# Patient Record
Sex: Male | Born: 1965 | Hispanic: Refuse to answer | Smoking: Never smoker
Health system: Southern US, Community
[De-identification: ages and names within clinical notes are randomized; demographics above are authoritative.]

## PROBLEM LIST (undated history)

## (undated) DIAGNOSIS — R7303 Prediabetes: Secondary | ICD-10-CM

## (undated) DIAGNOSIS — I10 Essential (primary) hypertension: Secondary | ICD-10-CM

## (undated) DIAGNOSIS — M5430 Sciatica, unspecified side: Secondary | ICD-10-CM

## (undated) DIAGNOSIS — F200 Paranoid schizophrenia: Secondary | ICD-10-CM

## (undated) DIAGNOSIS — E119 Type 2 diabetes mellitus without complications: Secondary | ICD-10-CM

## (undated) DIAGNOSIS — K219 Gastro-esophageal reflux disease without esophagitis: Secondary | ICD-10-CM

## (undated) DIAGNOSIS — D352 Benign neoplasm of pituitary gland: Secondary | ICD-10-CM

## (undated) DIAGNOSIS — D649 Anemia, unspecified: Secondary | ICD-10-CM

## (undated) DIAGNOSIS — E7800 Pure hypercholesterolemia, unspecified: Secondary | ICD-10-CM

## (undated) DIAGNOSIS — E1140 Type 2 diabetes mellitus with diabetic neuropathy, unspecified: Secondary | ICD-10-CM

## (undated) DIAGNOSIS — M1990 Unspecified osteoarthritis, unspecified site: Secondary | ICD-10-CM

## (undated) DIAGNOSIS — G4700 Insomnia, unspecified: Secondary | ICD-10-CM

## (undated) HISTORY — DX: Benign neoplasm of pituitary gland: D35.2

## (undated) HISTORY — DX: Type 2 diabetes mellitus with diabetic neuropathy, unspecified: E11.40

## (undated) HISTORY — PX: CARPAL TUNNEL RELEASE: SHX101

## (undated) HISTORY — PX: HERNIA REPAIR: SHX51

## (undated) HISTORY — DX: Gastro-esophageal reflux disease without esophagitis: K21.9

## (undated) HISTORY — PX: BACK SURGERY: SHX140

## (undated) HISTORY — PX: WRIST SURGERY: SHX841

## (undated) HISTORY — PX: EYE SURGERY: SHX253

## (undated) HISTORY — DX: Sciatica, unspecified side: M54.30

## (undated) HISTORY — PX: ROTATOR CUFF REPAIR: SHX139

## (undated) HISTORY — DX: Paranoid schizophrenia: F20.0

## (undated) HISTORY — DX: Unspecified osteoarthritis, unspecified site: M19.90

## (undated) HISTORY — DX: Type 2 diabetes mellitus without complications: E11.9

## (undated) HISTORY — PX: APPENDECTOMY: SHX54

---

## 2004-06-25 ENCOUNTER — Emergency Department (HOSPITAL_COMMUNITY): Admission: EM | Admit: 2004-06-25 | Discharge: 2004-06-25 | Payer: Self-pay | Admitting: Emergency Medicine

## 2005-01-03 ENCOUNTER — Ambulatory Visit: Payer: Self-pay | Admitting: Psychiatry

## 2005-01-03 ENCOUNTER — Inpatient Hospital Stay (HOSPITAL_COMMUNITY): Admission: RE | Admit: 2005-01-03 | Discharge: 2005-01-11 | Payer: Self-pay | Admitting: Psychiatry

## 2008-12-23 ENCOUNTER — Ambulatory Visit (HOSPITAL_COMMUNITY): Admission: RE | Admit: 2008-12-23 | Discharge: 2008-12-23 | Payer: Self-pay | Admitting: Neurosurgery

## 2010-10-04 LAB — GLUCOSE, CAPILLARY
Glucose-Capillary: 111 mg/dL — ABNORMAL HIGH (ref 70–99)
Glucose-Capillary: 120 mg/dL — ABNORMAL HIGH (ref 70–99)
Glucose-Capillary: 171 mg/dL — ABNORMAL HIGH (ref 70–99)

## 2010-10-04 LAB — COMPREHENSIVE METABOLIC PANEL
ALT: 27 U/L (ref 0–53)
AST: 31 U/L (ref 0–37)
Albumin: 4.7 g/dL (ref 3.5–5.2)
Alkaline Phosphatase: 110 U/L (ref 39–117)
BUN: 13 mg/dL (ref 6–23)
CO2: 24 mEq/L (ref 19–32)
Calcium: 10.1 mg/dL (ref 8.4–10.5)
Chloride: 105 mEq/L (ref 96–112)
Creatinine, Ser: 1.17 mg/dL (ref 0.4–1.5)
GFR calc Af Amer: >60 mL/min (ref >60)
GFR calc non Af Amer: >60 mL/min (ref >60)
Glucose, Bld: 127 mg/dL — ABNORMAL HIGH (ref 70–99)
Potassium: 4.4 mEq/L (ref 3.5–5.1)
Sodium: 138 mEq/L (ref 135–145)
Total Bilirubin: 0.6 mg/dL (ref 0.3–1.2)
Total Protein: 7.4 g/dL (ref 6.0–8.3)

## 2010-10-04 LAB — CBC
HCT: 40 % (ref 39.0–52.0)
Hemoglobin: 13.4 g/dL (ref 13.0–17.0)
MCHC: 33.4 g/dL (ref 30.0–36.0)
MCV: 84.5 fL (ref 78.0–100.0)
Platelets: 393 10*3/uL (ref 150–400)
RBC: 4.74 MIL/uL (ref 4.22–5.81)
RDW: 15.7 % — ABNORMAL HIGH (ref 11.5–15.5)
WBC: 10.8 10*3/uL — ABNORMAL HIGH (ref 4.0–10.5)

## 2010-10-04 LAB — DIFFERENTIAL
Basophils Absolute: 0.1 10*3/uL (ref 0.0–0.1)
Basophils Relative: 1 % (ref 0–1)
Eosinophils Absolute: 0.3 10*3/uL (ref 0.0–0.7)
Eosinophils Relative: 3 % (ref 0–5)
Lymphocytes Relative: 16 % (ref 12–46)
Lymphs Abs: 1.7 10*3/uL (ref 0.7–4.0)
Monocytes Absolute: 0.6 10*3/uL (ref 0.1–1.0)
Monocytes Relative: 6 % (ref 3–12)
Neutro Abs: 8.1 10*3/uL — ABNORMAL HIGH (ref 1.7–7.7)
Neutrophils Relative %: 75 % (ref 43–77)

## 2010-10-04 LAB — TYPE AND SCREEN
ABO/RH(D): A POS
Antibody Screen: NEGATIVE

## 2010-10-04 LAB — ABO/RH: ABO/RH(D): A POS

## 2010-11-09 NOTE — Op Note (Signed)
NAME:  Lee [name], Lee [name]                  ACCOUNT NO.:  [id number]   MEDICAL RECORD NO.:  [id number]          PATIENT TYPE:  OIB   LOCATION:  3524                         FACILITY:  [facility]   PHYSICIAN:  Henry A. Pool, M.D.    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   PROCEDURE NAME:  Right L5-S1 laminotomy and microdiskectomy.   SURGEON:  Kathaleen Maser. Pool, MD   ASSISTANT:  Reinaldo Meeker, MD   ANESTHESIA:  General endotracheal.   INDICATIONS:  Lee [name] is a 45 year old male status post previous L3-4  and L4-5 fusion procedures who presents now with back and right lower  extremity pain and paresthesias which is consistent with right-sided S1  radiculopathy.  Workup demonstrates evidence of a right-sided L5-S1 disk  herniation with compression of the right-sided S1 nerve root.  The  patient is counseled as to his options.  He decided to proceed with  simple microdiskectomy in the hopes of improving his symptoms.   OPERATIVE NOTE:  The patient was brought to the operative room and  placed on the operating table in the supine position.  After adequate  level of anesthesia was achieved, the patient was positioned prone onto  a Wilson frame, appropriately padded, the patient's lumbar region was  prepped and draped sterilely.  A 10 blade was used to make a curvilinear  skin incision overlying the L5-S1 interspace.  This was carried down  sharply in the midline.  A subperiosteal dissection was performed on the  right side exposing  the lamina and facet joints of L5 and S1 on the  right side.  Deep self-retaining retractor was placed.  Intraoperative x-  ray was taken and level was confirmed.  Laminotomy was then performed  using high-speed drill and Kerrison rongeurs to remove the inferior  aspect of lamina of L5 and medial aspect of L5-S1 facet joint, superior  rim of the S1 lamina.  Ligament flavum was then elevated and then  resected in the piecemeal fashion using Kerrison rongeurs.  The  underlying thecal sac and S1 nerve root were then identified.  Epidural  venous plexus was coagulated and cut.  Thecal sac and S1 nerve root were  gently mobilized and tracked towards the midline.  Disk herniation was  apparent.  This was then incised with 15 blade in a rectangular fashion.  Wide disk space clean clean-out was then achieved using pituitary  rongeurs, upward angled pituitary rongeurs, and Epstein curettes.  All  loose or obviously degenerative disk material was then removed from the  interspace.  All elements of the disk herniation was completely  resected.  At this point, a very thorough diskectomy was then achieved.  There was no evidence of injury to thecal sac or nerve roots.  Wound was  then irrigated with antibiotic solution.  Gelfoam was placed  topically  for hemostasis and found to be good.  Microscope  and retractor systems were removed.  Hemostasis was achieved with  electrocautery.  Wound was closed in layers with Vicryl suture.  Steri-  Strips and sterile dressing were applied.  There were no complications.  The patient tolerated the procedure well and he returned to the recovery  room postoperatively.           ______________________________  Kathaleen Maser Pool, M.D.     HAP/MEDQ  D:  12/23/2008  T:  12/24/2008  Job:  [id number]

## 2010-11-12 NOTE — H&P (Signed)
[name], [name]                  ACCOUNT NO.:  [id number]   MEDICAL RECORD NO.:  [id number]          PATIENT TYPE:  IPS   LOCATION:  0503                          [facility]   PHYSICIAN:  Lee [name], M.D.      DATE OF BIRTH:  04/13/1966   DATE OF ADMISSION:  01/03/2005  DATE OF DISCHARGE:                         PSYCHIATRIC ADMISSION ASSESSMENT   IDENTIFYING INFORMATION:  A 45 year old separated white male, voluntarily  admitted on January 03, 2005.   HISTORY OF PRESENT ILLNESS:  The patient presents with a history of suicidal  thoughts, increased anxiety, positive auditory hallucinations for  approximately 2 weeks.  States that they are the command type  telling him  to step out into traffic, overdose or jump out of a window.  He has been  sleeping only about 3-4 hours nightly.  He is not eating well.  The patient  reports that he has a history of methamphetamine use for the past 17 years  but has been clean for the past 17 months.  He is currently denying any  suicidal or homicidal thoughts, denies any visual hallucinations.  The  patient's stressors are that he has lost his Medicaid and also had recent  back surgery in May of this year.  Is also living with 8 other people in a  home which he states is very stressful, and has been off is medications for  the past 5 days.  The patient does promise safety on the unit.   PAST PSYCHIATRIC HISTORY:  First admission to [facility], was  hospitalized at [facility] in January of 2006 for overdose on Lunesta.  Also  had an overdose in June of 2005.  He has an upcoming appointment at  [facility], and has a therapist, first name Lee [name].   SOCIAL HISTORY:  He is a 45 year old separated white male, has a 75 year old  child, lives with his mother and brother and other family members.  He  applied for disability.  Denies any criminal charges.   FAMILY HISTORY:  Unclear.   ALCOHOL DRUG HISTORY:  The patient is a nonsmoker,  denies any alcohol use.  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   PAST MEDICAL HISTORY:  Primary care Lee [name] is Dr. Creta [name] at [facility]  in [place].  Medical problems are back pain with recent back surgery in May  of 2006.   MEDICATIONS:  Is prescribed Vicodin by Dr. Dorena [name] in [facility] in  [place].  Gets his medications at [facility] in [place].  Was on Zoloft 150  mg daily, has been off his Zoloft for approximately 5 days.   DRUG ALLERGIES:  NEURONTIN where he reports a history of seizure.   REVIEW OF SYSTEMS:  The patient denies any chest pain, dyspnea, nausea and  vomiting, blurred vision or changes in appetite.  Temperature is 100.  Pulse  is 129, respiratory rate is 24, blood pressure is 162/97.  Five feet 8  inches tall, 244 pounds.   PHYSICAL EXAMINATION:  GENERAL:  Appearance is an overweight, well-nourished  male.  He is somewhat disheveled and seems very anxious, keeps moving his  left leg.  Negative lymphadenopathy.  CHEST:  Clear.  HEART:  Regular rate and rhythm.  ABDOMEN:  Soft, nontender abdomen.  EXTREMITIES:  The patient moves all extremities, no clubbing or deformities.  SKIN:  Warm and dry with no rashes or lacerations noted.  NEUROLOGICAL FINDINGS:  Intact, nonfocal.   LABORATORY DATA:  WBC count is 11.  ALT is 50.  TSH is 2.615.  Alcohol level  is less than 5.   MENTAL STATUS EXAM:  He is a middle-aged male, anxious, rocking his legs,  fair eye contact.  Speech is clear.  Patient feels anxious, affect is also  anxious but he is cooperative.  Thought processes endorsing positive  auditory command-type hallucinations with suicidal thoughts.  Cognitive  function intact.  Memory is good, judgment is fair, insight is fair.   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  AXIS  II:  Deferred.  AXIS III:  Back pain status post back surgery in May of 2006.  AXIS IV:  Other psychosocial problems, medical problems, housing situation,  access to healthcare services.  AXIS V:  Current is 30.   PLAN:  Plan is to stabilize mood and thinking.  We will resume his Zoloft.  We will add Risperdal for hallucinations and calming, mood stabilization.  The patient is to follow up with mental health, continue with his therapist  appointment, and continue to work address the patient's history of substance  abuse.   TENTATIVE LENGTH OF CARE:  5-6 days.       JO/MEDQ  D:  01/04/2005  T:  01/04/2005  Job:  [id number]

## 2010-11-12 NOTE — Discharge Summary (Signed)
NAMEJHOEL, STIEG                  ACCOUNT NO.:  [id number]   MEDICAL RECORD NO.:  [id number]          PATIENT TYPE:  IPS   LOCATION:  0503                          FACILITY:  [facility]   PHYSICIAN:  Geoffery Lyons, M.D.      DATE OF BIRTH:  03-01-1966   DATE OF ADMISSION:  01/03/2005  DATE OF DISCHARGE:  01/11/2005                                 DISCHARGE SUMMARY   CHIEF COMPLAINT AND PRESENT ILLNESS:  This was the first admission to [facility] for this 45 year old separated white male voluntarily  admitted.  History of suicidal thoughts, increased anxiety, positive  auditory hallucinations for two weeks.  Claimed they were the command-type  telling him to step out into traffic, overdose or jump out of a window.  He  has been sleeping only about 3-4 hours nightly and not eating well.  Reported he has a history of methamphetamine use for the past 17 years.  Has  been clean for the past 17 months.  Currently denied any suicidal or  homicidal thoughts.  Denied any visual hallucinations.  He lost his Medicaid  and had recent back surgery.  Living with eight other people at home, which  he claimed was very stressful.  Has been off his medication for five days.   PAST PSYCHIATRIC HISTORY:  First time at [facility].  He was in [facility] in January of 2006 for overdose on Lunesta.  He also had an overdose  in June of 2005.   ALCOHOL/DRUG HISTORY:  Denies any alcohol use.  Reports a history of  methamphetamine addiction for a 17-year period.  Has been clean for 17  months.   PAST MEDICAL HISTORY:  Back pain, recent back surgery.   MEDICATIONS:  Vicodin, Zoloft 150 mg per day (off for five days).   PHYSICAL EXAMINATION:  Performed and failed to show any acute findings.   LABORATORY DATA:  CBC with white blood cells 11.0.  Blood chemistry with  glucose 139.  SGOT 34, SGPT 50.  TSH 2.615.  Drug screen positive for  opioids and benzodiazepines.   MENTAL STATUS EXAM:   Overweight male, somewhat disheveled, seemed very  anxious, kept moving his left leg, rocking his legs.  Fair eye contact.  Speech was clear, normal rate, tempo and production.  Endorsed he was  feeling anxious but is cooperative.  Affect anxiety.  Thought processes  endorsed positive auditory hallucinations, command-type, suicidal thoughts.  Cognition was well-preserved.   ADMISSION DIAGNOSES:  AXIS I:  Major depressive disorder with psychotic  features.  Amphetamine dependence, in remission.  AXIS II:  No diagnosis.  AXIS III:  Back pain, status post surgery.  AXIS IV:  Moderate.  AXIS V:  GAF upon admission 30; highest GAF in the last year 60-65.   HOSPITAL COURSE:  He was admitted.  He was started in individual and group  psychotherapy.  He was given Librium 25 mg every six hours as needed for  possible withdrawal, trazodone 50 mg at night for sleep.  He was given  Vicodin 5/500 mg,  2 tabs every 12 hours as needed.  He was maintained on  Zoloft 50 mg per day.  The trazodone was eventually discontinued, placed at  100 mg.  The Librium was discontinued.  He was placed on Risperdal 0.25 mg  in the morning and 0.5 mg in the afternoon.  He was given Vistaril 25 mg  every four hours as needed for anxiety.  He endorsed auditory  hallucinations, voices telling him to hurt himself but he could contract for  safety.  Endorsed depression, multiple stressors, going through separation,  divorced, living with his parents.  He is not able to work.  Endorsed back  pain.  Endorsed he had experienced depression before but not this bad with  voices telling him to hurt himself.  We continued to work with the  medication.  We continued to work with the Risperdal.  He stated that the  voices in the past had never told him to hurt himself.  He did remember that  he was on Zyprexa before but he discontinued Zyprexa once he left [place].  We increased the Risperdal but still with auditory hallucinations.   Felt the  Risperdal was not helping.  Did remember a better response to Zyprexa, so we  went ahead and switched to Zyprexa.  Zyprexa was established and adjusted.  He started sleeping a little better with the Zyprexa.  It was placed at 5 mg  twice a day and 10 mg at night.  He had to be given some Haldol p.r.n. for  acute hallucinations.  On July 15th, voices were telling him to kill  himself, that he was no good.  As the hospitalization progressed, he  endorsed that he was starting to feel better.  By July 17th, he was starting  to respond to the combination of Haldol and Zyprexa.  Voices were  decreasing.  Acknowledged it was easier to ignore them.  On July 18th, he  endorsed that he was much better.  The voices were decreased markedly.  Not  telling him to hurt himself.  His mood had improved.  His affect was  brighter.  There were no suicidal or homicidal ideation.  No hallucinations.  No delusions.  Was willing to follow up on an outpatient basis.   DISCHARGE DIAGNOSES:  AXIS I:  Major depression with psychotic features.  Amphetamine dependence, in remission.  AXIS II:  No diagnosis.  AXIS III:  Back pain, status post back surgery.  AXIS IV:  Moderate.  AXIS V:  GAF upon discharge 50.   DISCHARGE MEDICATIONS:  1.  Trazodone 100 mg at night.  2.  Zoloft 50 mg, 1-1/2 daily.  3.  Zyprexa Zydis 5 mg twice a day.  4.  Zyprexa Zydis 10 mg at night.  5.  Haldol 5 mg twice a day and at night.  6.  Cogentin 1 mg twice a day.  7.  He was given some Vicodin 5/500 mg, 2 tabs every 12 hours.   FOLLOW UP:  [facility].      Geoffery Lyons, M.D.  Electronically Signed     IL/MEDQ  D:  02/09/2005  T:  02/10/2005  Job:  [id number]

## 2016-11-23 ENCOUNTER — Emergency Department (HOSPITAL_BASED_OUTPATIENT_CLINIC_OR_DEPARTMENT_OTHER)
Admission: EM | Admit: 2016-11-23 | Discharge: 2016-11-24 | Disposition: A | Discharge status: Home / Self Care | Payer: Medicare HMO | Attending: Emergency Medicine | Admitting: Emergency Medicine

## 2016-11-23 ENCOUNTER — Encounter (HOSPITAL_BASED_OUTPATIENT_CLINIC_OR_DEPARTMENT_OTHER): Payer: Self-pay

## 2016-11-23 ENCOUNTER — Emergency Department (HOSPITAL_BASED_OUTPATIENT_CLINIC_OR_DEPARTMENT_OTHER): Payer: Medicare HMO

## 2016-11-23 DIAGNOSIS — S60211A Contusion of right wrist, initial encounter: Secondary | ICD-10-CM | POA: Insufficient documentation

## 2016-11-23 DIAGNOSIS — S2020XA Contusion of thorax, unspecified, initial encounter: Secondary | ICD-10-CM | POA: Diagnosis not present

## 2016-11-23 DIAGNOSIS — S20219A Contusion of unspecified front wall of thorax, initial encounter: Secondary | ICD-10-CM

## 2016-11-23 DIAGNOSIS — S8002XA Contusion of left knee, initial encounter: Secondary | ICD-10-CM | POA: Diagnosis not present

## 2016-11-23 DIAGNOSIS — Y939 Activity, unspecified: Secondary | ICD-10-CM | POA: Insufficient documentation

## 2016-11-23 DIAGNOSIS — M7989 Other specified soft tissue disorders: Secondary | ICD-10-CM | POA: Diagnosis not present

## 2016-11-23 DIAGNOSIS — R900 Intracranial space-occupying lesion found on diagnostic imaging of central nervous system: Secondary | ICD-10-CM | POA: Diagnosis not present

## 2016-11-23 DIAGNOSIS — S161XXA Strain of muscle, fascia and tendon at neck level, initial encounter: Secondary | ICD-10-CM | POA: Diagnosis not present

## 2016-11-23 DIAGNOSIS — I10 Essential (primary) hypertension: Secondary | ICD-10-CM | POA: Diagnosis not present

## 2016-11-23 DIAGNOSIS — S60551A Superficial foreign body of right hand, initial encounter: Secondary | ICD-10-CM | POA: Insufficient documentation

## 2016-11-23 DIAGNOSIS — Z7902 Long term (current) use of antithrombotics/antiplatelets: Secondary | ICD-10-CM | POA: Insufficient documentation

## 2016-11-23 DIAGNOSIS — S60551S Superficial foreign body of right hand, sequela: Secondary | ICD-10-CM

## 2016-11-23 DIAGNOSIS — Y929 Unspecified place or not applicable: Secondary | ICD-10-CM | POA: Insufficient documentation

## 2016-11-23 DIAGNOSIS — T07XXXA Unspecified multiple injuries, initial encounter: Secondary | ICD-10-CM | POA: Diagnosis present

## 2016-11-23 DIAGNOSIS — Y999 Unspecified external cause status: Secondary | ICD-10-CM | POA: Insufficient documentation

## 2016-11-23 DIAGNOSIS — S3992XA Unspecified injury of lower back, initial encounter: Secondary | ICD-10-CM

## 2016-11-23 HISTORY — DX: Prediabetes: R73.03

## 2016-11-23 HISTORY — DX: Insomnia, unspecified: G47.00

## 2016-11-23 HISTORY — DX: Essential (primary) hypertension: I10

## 2016-11-23 HISTORY — DX: Pure hypercholesterolemia, unspecified: E78.00

## 2016-11-23 MED ORDER — OXYCODONE-ACETAMINOPHEN 5-325 MG PO TABS
1.0000 | ORAL_TABLET | Freq: Once | ORAL | Status: AC
  Administered 2016-11-23: 1 via ORAL
  Filled 2016-11-23: qty 1

## 2016-11-23 NOTE — ED Notes (Signed)
Patient transported to CT 
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
## 2016-11-23 NOTE — ED Notes (Signed)
Patient transported to X-ray 

## 2016-11-24 NOTE — Discharge Instructions (Signed)
Apply ice to sore areas. Take naproxen (Aleve) as needed for pain.  Your x-rays showed a pice of metal on the back of your hand by the knuckle of your middle finger. This has been there for a long time. If it bothers you, your doctor can refer you to someone who can remove it.  Your CT scan showed a mass near your pituitary gland. Radiologist recommends MRI scan with and without contrast to try to tell what it is. Once we know what it is, your doctor can make appropriate referrals.

## 2017-01-12 ENCOUNTER — Other Ambulatory Visit: Payer: Self-pay | Admitting: Internal Medicine

## 2017-01-12 DIAGNOSIS — E237 Disorder of pituitary gland, unspecified: Secondary | ICD-10-CM

## 2017-01-22 ENCOUNTER — Ambulatory Visit
Admission: RE | Admit: 2017-01-22 | Discharge: 2017-01-22 | Disposition: A | Discharge status: Home / Self Care | Payer: Medicare HMO | Source: Ambulatory Visit | Attending: Internal Medicine | Admitting: Internal Medicine

## 2017-01-22 DIAGNOSIS — E237 Disorder of pituitary gland, unspecified: Secondary | ICD-10-CM

## 2017-03-16 ENCOUNTER — Encounter: Payer: Self-pay | Admitting: *Deleted

## 2017-03-16 NOTE — Progress Notes (Addendum)
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 Patient reports he is having diplopia. Patient prefers to go to [facility] directly and see me if needed due to his high copay. Spoke with Windell Hummingbird, PA this mornng who is the referring provider and she will order the NSY, endo referral and labs. The initial screening endocrine tests should include prolactin, thyroid panel, testosterone which should be sufficient and let endo test further. Should be checked before going to NSYwith results sent to [facility] and aldo referral and labs sent to endocrinology.

## 2017-03-17 ENCOUNTER — Ambulatory Visit (INDEPENDENT_AMBULATORY_CARE_PROVIDER_SITE_OTHER): Payer: Medicare HMO | Admitting: Neurology

## 2017-03-17 ENCOUNTER — Telehealth: Payer: Self-pay | Admitting: Neurology

## 2017-03-17 DIAGNOSIS — D352 Benign neoplasm of pituitary gland: Secondary | ICD-10-CM

## 2017-03-17 DIAGNOSIS — H532 Diplopia: Secondary | ICD-10-CM

## 2017-03-17 NOTE — Telephone Encounter (Signed)
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 Patient reports he is having diplopia. Patient prefers to go to [facility] directly and see me if needed due to his high copay. Spoke with Windell Hummingbird and she will order the NSY referral. Also recommend endocrinology referral, will leave that up to pcp's discretion whether they would like to refer to endocrinology or screen patient for hormone anomalies themselves(see below). Happy to see patient if [facility] thinks he still should be seen here with [place].  Called referring provider, Windell Hummingbird, discussed. The initial screening endocrine tests should include levels of prolactin, IGF-1, LH, FSH, TRH and alpha subunit, cortisol, and T4; men should have testosterone level checked. Should be checked before going to [facility]Ywith results sent to [place] and aldo referral sent to endocrinology.

## 2017-03-27 ENCOUNTER — Emergency Department (HOSPITAL_COMMUNITY)
Admission: EM | Admit: 2017-03-27 | Discharge: 2017-03-27 | Disposition: A | Discharge status: Home / Self Care | Payer: Medicare HMO | Attending: Emergency Medicine | Admitting: Emergency Medicine

## 2017-03-27 ENCOUNTER — Emergency Department (HOSPITAL_COMMUNITY): Payer: Medicare HMO

## 2017-03-27 ENCOUNTER — Encounter (HOSPITAL_COMMUNITY): Payer: Self-pay | Admitting: Emergency Medicine

## 2017-03-27 DIAGNOSIS — Z79899 Other long term (current) drug therapy: Secondary | ICD-10-CM | POA: Insufficient documentation

## 2017-03-27 DIAGNOSIS — E1140 Type 2 diabetes mellitus with diabetic neuropathy, unspecified: Secondary | ICD-10-CM | POA: Insufficient documentation

## 2017-03-27 DIAGNOSIS — R110 Nausea: Secondary | ICD-10-CM | POA: Diagnosis not present

## 2017-03-27 DIAGNOSIS — D497 Neoplasm of unspecified behavior of endocrine glands and other parts of nervous system: Secondary | ICD-10-CM

## 2017-03-27 DIAGNOSIS — H532 Diplopia: Secondary | ICD-10-CM | POA: Insufficient documentation

## 2017-03-27 DIAGNOSIS — D352 Benign neoplasm of pituitary gland: Secondary | ICD-10-CM | POA: Diagnosis not present

## 2017-03-27 DIAGNOSIS — R51 Headache: Secondary | ICD-10-CM | POA: Diagnosis present

## 2017-03-27 LAB — CBC WITH DIFFERENTIAL/PLATELET
Basophils Absolute: 0 10*3/uL (ref 0.0–0.1)
Basophils Relative: 1 %
Eosinophils Absolute: 0.5 10*3/uL (ref 0.0–0.7)
Eosinophils Relative: 10 %
HCT: 32.8 % — ABNORMAL LOW (ref 39.0–52.0)
Hemoglobin: 10.6 g/dL — ABNORMAL LOW (ref 13.0–17.0)
Lymphocytes Relative: 46 %
Lymphs Abs: 2.3 10*3/uL (ref 0.7–4.0)
MCH: 28.6 pg (ref 26.0–34.0)
MCHC: 32.3 g/dL (ref 30.0–36.0)
MCV: 88.6 fL (ref 78.0–100.0)
Monocytes Absolute: 0.3 10*3/uL (ref 0.1–1.0)
Monocytes Relative: 6 %
Neutro Abs: 1.8 10*3/uL (ref 1.7–7.7)
Neutrophils Relative %: 37 %
Platelets: 216 10*3/uL (ref 150–400)
RBC: 3.7 MIL/uL — ABNORMAL LOW (ref 4.22–5.81)
RDW: 13.5 % (ref 11.5–15.5)
WBC: 5 10*3/uL (ref 4.0–10.5)

## 2017-03-27 LAB — BASIC METABOLIC PANEL
Anion gap: 6 (ref 5–15)
BUN: 8 mg/dL (ref 6–20)
CO2: 25 mmol/L (ref 22–32)
Calcium: 9.4 mg/dL (ref 8.9–10.3)
Chloride: 107 mmol/L (ref 101–111)
Creatinine, Ser: 1.32 mg/dL — ABNORMAL HIGH (ref 0.61–1.24)
GFR calc Af Amer: >60 mL/min (ref >60)
GFR calc non Af Amer: >60 mL/min (ref >60)
Glucose, Bld: 89 mg/dL (ref 65–99)
Potassium: 3.8 mmol/L (ref 3.5–5.1)
Sodium: 138 mmol/L (ref 135–145)

## 2017-03-27 LAB — TSH: TSH: 2.997 u[IU]/mL (ref 0.350–4.500)

## 2017-03-27 LAB — I-STAT CREATININE, ED
Creatinine, Ser: 0.6 mg/dL — ABNORMAL LOW (ref 0.61–1.24)
Creatinine, Ser: 1.3 mg/dL — ABNORMAL HIGH (ref 0.61–1.24)

## 2017-03-27 LAB — CORTISOL: Cortisol, Plasma: 3 ug/dL

## 2017-03-27 MED ORDER — GADOBENATE DIMEGLUMINE 529 MG/ML IV SOLN
10.0000 mL | Freq: Once | INTRAVENOUS | Status: AC | PRN
  Administered 2017-03-27: 10 mL via INTRAVENOUS

## 2017-03-27 MED ORDER — DIPHENHYDRAMINE HCL 50 MG/ML IJ SOLN
12.5000 mg | Freq: Once | INTRAMUSCULAR | Status: AC
  Administered 2017-03-27: 12.5 mg via INTRAVENOUS
  Filled 2017-03-27: qty 1

## 2017-03-27 MED ORDER — OXYCODONE-ACETAMINOPHEN 5-325 MG PO TABS
1.0000 | ORAL_TABLET | ORAL | Status: DC | PRN
  Administered 2017-03-27: 1 via ORAL

## 2017-03-27 MED ORDER — METOCLOPRAMIDE HCL 5 MG/ML IJ SOLN
10.0000 mg | Freq: Once | INTRAMUSCULAR | Status: AC
  Administered 2017-03-27: 10 mg via INTRAVENOUS
  Filled 2017-03-27: qty 2

## 2017-03-27 MED ORDER — OXYCODONE-ACETAMINOPHEN 5-325 MG PO TABS
ORAL_TABLET | ORAL | Status: AC
  Filled 2017-03-27: qty 1

## 2017-03-27 MED ORDER — SODIUM CHLORIDE 0.9 % IV SOLN
INTRAVENOUS | Status: DC
  Administered 2017-03-27: 20 mL via INTRAVENOUS

## 2017-03-27 MED ORDER — BUTALBITAL-APAP-CAFFEINE 50-325-40 MG PO TABS: 1.0000 | ORAL_TABLET | Freq: Four times a day (QID) | ORAL | 0 refills | Status: DC | PRN

## 2017-03-27 NOTE — Discharge Instructions (Signed)
Follow up with Dr. Cyndy Freeze

## 2017-03-27 NOTE — ED Notes (Signed)
Pt c/o headache that has gotten worse over the weekend-- has hx pituitary tumor-- brought disc-- has an appt on Oct 5th with [facility], Nov 26th with [facility]. Unable to tolerate headache-- has had hx of same,.   was unable to go to work this am .

## 2017-03-27 NOTE — ED Triage Notes (Signed)
Pt reports headache worse in the last two days, states hx of pituitary tumor. Worse with movement, described as pounding.

## 2017-03-27 NOTE — ED Provider Notes (Signed)
[facility] DEPT Provider Note   CSN: [id number] Arrival date & time: 03/27/17  0554     History   Chief Complaint Chief Complaint  Patient presents with  . Headache    HPI Lee [name] is a 51 y.o. male.  51 year old male with history of pituitary adenoma diagnosed 5 months ago presents with worsening occipital headache without loss of vision. He has has some diplopia. His headache is worse in the mornings associated nausea but no vomiting. No fever or neck pain. Denies any ataxia or weakness in his arms or legs. Has been prescribed NSAIDs which have not helped. Patient is scheduled to see an endocrinologist as well as a neurosurgeon at [facility] but that is in several weeks.      Past Medical History:  Diagnosis Date  . Diabetes ([place])    type 2  . Diabetic neuropathy ([place])   . GERD (gastroesophageal reflux disease)   . High cholesterol   . Hypertension   . Insomnia   . Osteoarthritis   . Paranoid schizophrenia, subchronic condition ([place])    Dr Ruthell Rummage, [place], [place]  . Pituitary adenoma ([place])   . Prediabetes   . Sciatica    low back pain    There are no active problems to display for this patient.   Past Surgical History:  Procedure Laterality Date  . APPENDECTOMY    . BACK SURGERY    . CARPAL TUNNEL RELEASE    . HERNIA REPAIR    . ROTATOR CUFF REPAIR    . WRIST SURGERY         Home Medications    Prior to Admission medications   Medication Sig Start Date End Date Taking? Authorizing Provider  lisinopril (PRINIVIL,ZESTRIL) 10 MG tablet Take 10 mg by mouth daily.    [provider]  naproxen sodium (ANAPROX) 220 MG tablet Take 220 mg by mouth 2 (two) times daily with a meal.    [provider]  pravastatin (PRAVACHOL) 10 MG tablet Take 10 mg by mouth daily.    [provider]  traZODone (DESYREL) 150 MG tablet Take by mouth at bedtime.    [provider]    Family History Family  History  Problem Relation Age of Onset  . Diabetes Mother   . Hypertension Mother   . Diabetes Father   . Hypertension Father   . Anxiety disorder Sister   . Diabetes Brother   . Depression Brother     Social History Social History  Substance Use Topics  . Smoking status: Never Smoker  . Smokeless tobacco: Never Used  . Alcohol use Yes     Comment: weekly     Allergies   Strawberry (diagnostic)   Review of Systems Review of Systems  All other systems reviewed and are negative.    Physical Exam Updated Vital Signs BP 99/76 (BP Location: Left Arm)   Pulse (!) 57   Temp 98 F (36.7 C) (Oral)   Resp 16   Ht 1.727 m (5\' 8" )   Wt 93.9 kg (207 lb)   SpO2 100%   BMI 31.47 kg/m   Physical Exam  Constitutional: He is oriented to person, place, and time. He appears well-developed and well-nourished.  Non-toxic appearance. No distress.  HENT:  Head: Normocephalic and atraumatic.  Eyes: Pupils are equal, round, and reactive to light. Conjunctivae, EOM and lids are normal.  Neck: Normal range of motion. Neck supple. No tracheal deviation present. No thyroid mass  present.  Cardiovascular: Normal rate, regular rhythm and normal heart sounds.  Exam reveals no gallop.   No murmur heard. Pulmonary/Chest: Effort normal and breath sounds normal. No stridor. No respiratory distress. He has no decreased breath sounds. He has no wheezes. He has no rhonchi. He has no rales.  Abdominal: Soft. Normal appearance and bowel sounds are normal. He exhibits no distension. There is no tenderness. There is no rebound and no CVA tenderness.  Musculoskeletal: Normal range of motion. He exhibits no edema or tenderness.  Neurological: He is alert and oriented to person, place, and time. He has normal strength. No cranial nerve deficit or sensory deficit. GCS eye subscore is 4. GCS verbal subscore is 5. GCS motor subscore is 6.  Skin: Skin is warm and dry. No abrasion and no rash noted.    Psychiatric: He has a normal mood and affect. His speech is normal and behavior is normal.  Nursing note and vitals reviewed.    ED Treatments / Results  Labs (all labs ordered are listed, but only abnormal results are displayed) Labs Reviewed  CBC WITH DIFFERENTIAL/PLATELET  BASIC METABOLIC PANEL    EKG  EKG Interpretation None       Radiology No results found.  Procedures Procedures (including critical care time)  Medications Ordered in ED Medications  oxyCODONE-acetaminophen (PERCOCET/ROXICET) 5-325 MG per tablet 1 tablet (1 tablet Oral Given 03/27/17 0605)  oxyCODONE-acetaminophen (PERCOCET/ROXICET) 5-325 MG per tablet (not administered)  0.9 %  sodium chloride infusion (not administered)  metoCLOPramide (REGLAN) injection 10 mg (not administered)  diphenhydrAMINE (BENADRYL) injection 12.5 mg (not administered)     Initial Impression / Assessment and Plan / ED Course  I have reviewed the triage vital signs and the nursing notes.  Pertinent labs & imaging results that were available during my care of the patient were reviewed by me and considered in my medical decision making (see chart for details).     Patient had MRI of his brain which is unchanged from prior studies. Seen by neurosurgery and outpatient follow-up has been scheduled.  Final Clinical Impressions(s) / ED Diagnoses   Final diagnoses:  None    New Prescriptions New Prescriptions   No medications on file     Lacretia Leigh, MD 03/27/17 1205

## 2017-03-28 LAB — FOLLICLE STIMULATING HORMONE: FSH: 2.9 m[IU]/mL (ref 1.5–12.4)

## 2017-03-28 LAB — INSULIN-LIKE GROWTH FACTOR: Somatomedin C: 46 ng/mL — ABNORMAL LOW (ref 67–205)

## 2017-03-28 LAB — PROLACTIN: Prolactin: 33.7 ng/mL — ABNORMAL HIGH (ref 4.0–15.2)

## 2017-04-13 ENCOUNTER — Other Ambulatory Visit: Payer: Self-pay | Admitting: Neurosurgery

## 2017-04-17 ENCOUNTER — Encounter (HOSPITAL_COMMUNITY)
Admission: RE | Admit: 2017-04-17 | Discharge: 2017-04-17 | Disposition: A | Discharge status: Home / Self Care | Payer: Medicare HMO | Source: Ambulatory Visit | Attending: Neurosurgery | Admitting: Neurosurgery

## 2017-04-17 ENCOUNTER — Encounter (HOSPITAL_COMMUNITY): Payer: Self-pay

## 2017-04-17 HISTORY — DX: Anemia, unspecified: D64.9

## 2017-04-17 LAB — BASIC METABOLIC PANEL
Anion gap: 8 (ref 5–15)
BUN: 13 mg/dL (ref 6–20)
CO2: 23 mmol/L (ref 22–32)
Calcium: 9.3 mg/dL (ref 8.9–10.3)
Chloride: 103 mmol/L (ref 101–111)
Creatinine, Ser: 0.98 mg/dL (ref 0.61–1.24)
GFR calc Af Amer: >60 mL/min (ref >60)
GFR calc non Af Amer: >60 mL/min (ref >60)
Glucose, Bld: 91 mg/dL (ref 65–99)
Potassium: 4 mmol/L (ref 3.5–5.1)
Sodium: 134 mmol/L — ABNORMAL LOW (ref 135–145)

## 2017-04-17 LAB — CBC
HCT: 32.5 % — ABNORMAL LOW (ref 39.0–52.0)
Hemoglobin: 10.6 g/dL — ABNORMAL LOW (ref 13.0–17.0)
MCH: 28.7 pg (ref 26.0–34.0)
MCHC: 32.6 g/dL (ref 30.0–36.0)
MCV: 88.1 fL (ref 78.0–100.0)
Platelets: 250 10*3/uL (ref 150–400)
RBC: 3.69 MIL/uL — ABNORMAL LOW (ref 4.22–5.81)
RDW: 13.5 % (ref 11.5–15.5)
WBC: 6.1 10*3/uL (ref 4.0–10.5)

## 2017-04-17 LAB — HEMOGLOBIN A1C
Hgb A1c MFr Bld: 5.5 % (ref 4.8–5.6)
Mean Plasma Glucose: 111.15 mg/dL

## 2017-04-17 NOTE — Progress Notes (Signed)
Called dr Danie Binder office for orders

## 2017-04-17 NOTE — Pre-Procedure Instructions (Signed)
ERIQ HUFFORD  04/17/2017      [id number] - [place], [place] - [address] [place] [address] Phone: [phone] Fax: [phone]    Your procedure is scheduled on October 25  Report to [facility] at [place].M.  Call this number if you have problems the morning of surgery:  [phone]   Remember:  Do not eat food or drink liquids after midnight.  Continue all other medications as directed by your physician except follow these medication instructions before surgery   Take these medicines the morning of surgery with A SIP OF WATER  baclofen (LIORESAL omeprazole (PRILOSEC)   7 days prior to surgery STOP taking any Aspirin (unless otherwise instructed by your surgeon), Aleve, Naproxen, Ibuprofen, Motrin, Advil, Goody's, BC's, all herbal medications, fish oil, and all vitamins diclofenac (VOLTAREN)    Do not wear jewelry  Do not wear lotions, powders, or cologne, or deoderant.  Men may shave face and neck.  Do not bring valuables to the hospital.  [facility] is not responsible for any belongings or valuables.  Contacts, dentures or bridgework may not be worn into surgery.  Leave your suitcase in the car.  After surgery it may be brought to your room.  For patients admitted to the hospital, discharge time will be determined by your treatment team.  Patients discharged the day of surgery will not be allowed to drive home.    Special instructions:   [facility]- Preparing For Surgery  Before surgery, you can play an important role. Because skin is not sterile, your skin needs to be as free of germs as possible. You can reduce the number of germs on your skin by washing with CHG (chlorahexidine gluconate) Soap before surgery.  CHG is an antiseptic cleaner which kills germs and bonds with the skin to continue killing germs even after washing.  Please do not use if you have an allergy to CHG or antibacterial soaps. If  your skin becomes reddened/irritated stop using the CHG.  Do not shave (including legs and underarms) for at least 48 hours prior to first CHG shower. It is OK to shave your face.  Please follow these instructions carefully.   1. Shower the NIGHT BEFORE SURGERY and the MORNING OF SURGERY with CHG.   2. If you chose to wash your hair, wash your hair first as usual with your normal shampoo.  3. After you shampoo, rinse your hair and body thoroughly to remove the shampoo.  4. Use CHG as you would any other liquid soap. You can apply CHG directly to the skin and wash gently with a scrungie or a clean washcloth.   5. Apply the CHG Soap to your body ONLY FROM THE NECK DOWN.  Do not use on open wounds or open sores. Avoid contact with your eyes, ears, mouth and genitals (private parts). Wash Face and genitals (private parts)  with your normal soap.  6. Wash thoroughly, paying special attention to the area where your surgery will be performed.  7. Thoroughly rinse your body with warm water from the neck down.  8. DO NOT shower/wash with your normal soap after using and rinsing off the CHG Soap.  9. Pat yourself dry with a CLEAN TOWEL.  10. Wear CLEAN PAJAMAS to bed the night before surgery, wear comfortable clothes the morning of surgery  11. Place CLEAN SHEETS on your bed the night of your first shower and DO  NOT SLEEP WITH PETS.    Day of Surgery: Do not apply any deodorants/lotions. Please wear clean clothes to the hospital/surgery center.      Please read over the following fact sheets that you were given.

## 2017-04-17 NOTE — Progress Notes (Addendum)
PCP - [facility] Cardiologist - denies  Chest x-ray - 02/23/17 EKG - 02/23/17 requesting Stress Test - 02/08/17 ECHO - denies Cardiac Cath - denies  Patient denies that he is diabetic said that he was not managing his diabetes or checking his sugars.   Sending to anesthesia for review    Patient denies shortness of breath, fever, cough and chest pain at PAT appointment   Patient verbalized understanding of instructions that were given to them at the PAT appointment. Patient was also instructed that they will need to review over the PAT instructions again at home before surgery.

## 2017-04-18 NOTE — Progress Notes (Addendum)
Anesthesia Chart Review:  Pt is a 51 year old male scheduled for transphenoidal resection of pituitary tumor, endoscopic transphenoidal resection with fusion scan on 04/20/2017 with Ashok Pall, MD and Jerrell Belfast, MD  - [facility]  PMH includes:  HTN, prediabetes, hyperlipidemia, paranoid schizophrenia, anemia, pituitary adenoma, GERD. Never smoker. BMI 32  Medications include: lisinopril, prilosec, pravastatin  BP 115/71   Pulse 75   Temp 36.5 C   Resp 18   Ht 5\' 8"  (1.727 m)   Wt 210 lb 11.2 oz (95.6 kg)   SpO2 100%   BMI 32.04 kg/m   Preoperative labs reviewed.   - HbA1c 5.5, glucose 91  CXR 11/23/16: No acute cardiopulmonary process seen. No displaced rib fractures identified.  EKG will be obtained DOS  Stress echo 02/08/17 (care everywhere): Normal Stress Echocardiogram with no ischemia suggested  If EKG acceptable DOS, I anticipate pt can proceed as scheduled.   Willeen Cass, FNP-BC [facility]/Anesthesiology Phone: [phone] 04/18/2017 3:25 PM

## 2017-04-18 NOTE — Progress Notes (Signed)
Dr. Danie Binder office called about orders for consent they are putting orders in

## 2017-04-19 ENCOUNTER — Other Ambulatory Visit: Payer: Self-pay | Admitting: Otolaryngology

## 2017-04-20 ENCOUNTER — Encounter (HOSPITAL_COMMUNITY): Payer: Self-pay | Admitting: Certified Registered"

## 2017-04-20 ENCOUNTER — Encounter (HOSPITAL_COMMUNITY)
Admission: RE | Disposition: A | Discharge status: Home / Self Care | Payer: Self-pay | Source: Ambulatory Visit | Attending: Neurosurgery

## 2017-04-20 ENCOUNTER — Inpatient Hospital Stay (HOSPITAL_COMMUNITY): Payer: Medicare HMO | Admitting: Emergency Medicine

## 2017-04-20 ENCOUNTER — Inpatient Hospital Stay (HOSPITAL_COMMUNITY)
Admission: RE | Admit: 2017-04-20 | Discharge: 2017-04-26 | DRG: 615 | Disposition: A | Discharge status: Home / Self Care | Payer: Medicare HMO | Source: Ambulatory Visit | Attending: Neurosurgery | Admitting: Neurosurgery

## 2017-04-20 ENCOUNTER — Inpatient Hospital Stay (HOSPITAL_COMMUNITY): Payer: Medicare HMO | Admitting: Certified Registered"

## 2017-04-20 DIAGNOSIS — Z981 Arthrodesis status: Secondary | ICD-10-CM

## 2017-04-20 DIAGNOSIS — K219 Gastro-esophageal reflux disease without esophagitis: Secondary | ICD-10-CM | POA: Diagnosis present

## 2017-04-20 DIAGNOSIS — Z833 Family history of diabetes mellitus: Secondary | ICD-10-CM

## 2017-04-20 DIAGNOSIS — E119 Type 2 diabetes mellitus without complications: Secondary | ICD-10-CM | POA: Diagnosis present

## 2017-04-20 DIAGNOSIS — D497 Neoplasm of unspecified behavior of endocrine glands and other parts of nervous system: Secondary | ICD-10-CM | POA: Diagnosis present

## 2017-04-20 DIAGNOSIS — J342 Deviated nasal septum: Secondary | ICD-10-CM | POA: Diagnosis present

## 2017-04-20 DIAGNOSIS — Z8249 Family history of ischemic heart disease and other diseases of the circulatory system: Secondary | ICD-10-CM | POA: Diagnosis not present

## 2017-04-20 DIAGNOSIS — E7800 Pure hypercholesterolemia, unspecified: Secondary | ICD-10-CM | POA: Diagnosis present

## 2017-04-20 HISTORY — PX: TRANSNASAL APPROACH: SHX6149

## 2017-04-20 HISTORY — PX: CRANIOTOMY: SHX93

## 2017-04-20 LAB — TYPE AND SCREEN
ABO/RH(D): A POS
Antibody Screen: NEGATIVE

## 2017-04-20 LAB — GLUCOSE, CAPILLARY
Glucose-Capillary: 109 mg/dL — ABNORMAL HIGH (ref 65–99)
Glucose-Capillary: 108 mg/dL — ABNORMAL HIGH (ref 65–99)

## 2017-04-20 LAB — SODIUM: Sodium: 137 mmol/L (ref 135–145)

## 2017-04-20 SURGERY — CRANIOTOMY HYPOPHYSECTOMY TRANSNASAL APPROACH
Anesthesia: General

## 2017-04-20 MED ORDER — BACITRACIN ZINC 500 UNIT/GM EX OINT
TOPICAL_OINTMENT | CUTANEOUS | Status: DC | PRN
  Administered 2017-04-20: 1 via TOPICAL

## 2017-04-20 MED ORDER — GLYCOPYRROLATE 0.2 MG/ML IV SOSY
PREFILLED_SYRINGE | INTRAVENOUS | Status: DC | PRN
  Administered 2017-04-20: .2 mg via INTRAVENOUS

## 2017-04-20 MED ORDER — CEFAZOLIN SODIUM-DEXTROSE 2-4 GM/100ML-% IV SOLN
2.0000 g | INTRAVENOUS | Status: AC
  Administered 2017-04-20: 2 g via INTRAVENOUS

## 2017-04-20 MED ORDER — OXYMETAZOLINE HCL 0.05 % NA SOLN
NASAL | Status: AC
  Filled 2017-04-20: qty 15

## 2017-04-20 MED ORDER — TRAZODONE HCL 50 MG PO TABS
150.0000 mg | ORAL_TABLET | Freq: Every day | ORAL | Status: DC
  Administered 2017-04-20 – 2017-04-25 (×6): 150 mg via ORAL
  Filled 2017-04-20 (×7): qty 1

## 2017-04-20 MED ORDER — CHLORHEXIDINE GLUCONATE CLOTH 2 % EX PADS: 6.0000 | MEDICATED_PAD | Freq: Once | CUTANEOUS | Status: DC

## 2017-04-20 MED ORDER — EPHEDRINE SULFATE-NACL 50-0.9 MG/10ML-% IV SOSY
PREFILLED_SYRINGE | INTRAVENOUS | Status: DC | PRN
  Administered 2017-04-20: 5 mg via INTRAVENOUS
  Administered 2017-04-20: 10 mg via INTRAVENOUS

## 2017-04-20 MED ORDER — TRIAMCINOLONE ACETONIDE 0.1 % EX CREA
1.0000 "application " | TOPICAL_CREAM | Freq: Two times a day (BID) | CUTANEOUS | Status: DC | PRN
  Filled 2017-04-20: qty 15

## 2017-04-20 MED ORDER — LACTATED RINGERS IV SOLN
INTRAVENOUS | Status: DC | PRN
  Administered 2017-04-20 (×2): via INTRAVENOUS

## 2017-04-20 MED ORDER — HYDROMORPHONE HCL 1 MG/ML IJ SOLN
INTRAMUSCULAR | Status: AC
  Filled 2017-04-20: qty 1

## 2017-04-20 MED ORDER — EPHEDRINE 5 MG/ML INJ
INTRAVENOUS | Status: AC
  Filled 2017-04-20: qty 10

## 2017-04-20 MED ORDER — LISINOPRIL 10 MG PO TABS
10.0000 mg | ORAL_TABLET | Freq: Every day | ORAL | Status: DC
  Administered 2017-04-20 – 2017-04-25 (×5): 10 mg via ORAL
  Filled 2017-04-20 (×7): qty 1

## 2017-04-20 MED ORDER — BACLOFEN 10 MG PO TABS
10.0000 mg | ORAL_TABLET | Freq: Three times a day (TID) | ORAL | Status: DC
  Administered 2017-04-20 – 2017-04-21 (×4): 10 mg via ORAL
  Filled 2017-04-20 (×4): qty 1

## 2017-04-20 MED ORDER — SENNOSIDES-DOCUSATE SODIUM 8.6-50 MG PO TABS: 1.0000 | ORAL_TABLET | Freq: Every evening | ORAL | Status: DC | PRN

## 2017-04-20 MED ORDER — HYDROCORTISONE NA SUCCINATE PF 100 MG IJ SOLR
INTRAMUSCULAR | Status: DC | PRN
  Administered 2017-04-20: 100 mg via INTRAVENOUS

## 2017-04-20 MED ORDER — PHENYLEPHRINE HCL 10 MG/ML IJ SOLN
INTRAVENOUS | Status: DC | PRN
  Administered 2017-04-20: 20 ug/min via INTRAVENOUS

## 2017-04-20 MED ORDER — ACETAMINOPHEN 650 MG RE SUPP: 650.0000 mg | RECTAL | Status: DC | PRN

## 2017-04-20 MED ORDER — SODIUM CHLORIDE 0.9 % IV SOLN: Freq: Once | INTRAVENOUS | Status: DC

## 2017-04-20 MED ORDER — OXYMETAZOLINE HCL 0.05 % NA SOLN
NASAL | Status: DC | PRN
  Administered 2017-04-20 (×2): 1 via TOPICAL

## 2017-04-20 MED ORDER — EPINEPHRINE HCL (NASAL) 0.1 % NA SOLN
NASAL | Status: AC
  Filled 2017-04-20: qty 30

## 2017-04-20 MED ORDER — SALINE SPRAY 0.65 % NA SOLN
4.0000 | NASAL | Status: DC | PRN
  Administered 2017-04-23: 4 via NASAL
  Filled 2017-04-20 (×3): qty 44

## 2017-04-20 MED ORDER — ONDANSETRON HCL 4 MG/2ML IJ SOLN: 4.0000 mg | INTRAMUSCULAR | Status: DC | PRN

## 2017-04-20 MED ORDER — 0.9 % SODIUM CHLORIDE (POUR BTL) OPTIME
TOPICAL | Status: DC | PRN
  Administered 2017-04-20: 1000 mL

## 2017-04-20 MED ORDER — OXYMETAZOLINE HCL 0.05 % NA SOLN
NASAL | Status: DC | PRN
  Administered 2017-04-20: 2 via NASAL

## 2017-04-20 MED ORDER — DEXAMETHASONE SODIUM PHOSPHATE 10 MG/ML IJ SOLN
INTRAMUSCULAR | Status: AC
  Filled 2017-04-20: qty 1

## 2017-04-20 MED ORDER — BACITRACIN ZINC 500 UNIT/GM EX OINT
TOPICAL_OINTMENT | CUTANEOUS | Status: AC
  Filled 2017-04-20: qty 28.35

## 2017-04-20 MED ORDER — SODIUM CHLORIDE 0.9 % IR SOLN
Status: DC | PRN
  Administered 2017-04-20: 1000 mL

## 2017-04-20 MED ORDER — SODIUM CHLORIDE 0.9 % IV SOLN
0.0125 ug/kg/min | INTRAVENOUS | Status: DC
  Administered 2017-04-20: .1 ug/kg/min via INTRAVENOUS
  Filled 2017-04-20 (×2): qty 2000

## 2017-04-20 MED ORDER — DEXMEDETOMIDINE HCL IN NACL 200 MCG/50ML IV SOLN
INTRAVENOUS | Status: AC
  Filled 2017-04-20: qty 50

## 2017-04-20 MED ORDER — ARTIFICIAL TEARS OPHTHALMIC OINT
TOPICAL_OINTMENT | OPHTHALMIC | Status: AC
  Filled 2017-04-20: qty 3.5

## 2017-04-20 MED ORDER — PROPOFOL 10 MG/ML IV BOLUS
INTRAVENOUS | Status: DC | PRN
  Administered 2017-04-20: 150 mg via INTRAVENOUS

## 2017-04-20 MED ORDER — LACTATED RINGERS IV SOLN
INTRAVENOUS | Status: DC | PRN
  Administered 2017-04-20: 07:00:00 via INTRAVENOUS

## 2017-04-20 MED ORDER — ROCURONIUM BROMIDE 10 MG/ML (PF) SYRINGE
PREFILLED_SYRINGE | INTRAVENOUS | Status: AC
  Filled 2017-04-20: qty 5

## 2017-04-20 MED ORDER — LIDOCAINE-EPINEPHRINE 1 %-1:100000 IJ SOLN
INTRAMUSCULAR | Status: AC
  Filled 2017-04-20: qty 1

## 2017-04-20 MED ORDER — TRIAMCINOLONE ACETONIDE 40 MG/ML IJ SUSP
INTRAMUSCULAR | Status: AC
  Filled 2017-04-20: qty 5

## 2017-04-20 MED ORDER — CLINDAMYCIN PHOSPHATE 900 MG/50ML IV SOLN
900.0000 mg | INTRAVENOUS | Status: AC
  Administered 2017-04-20: 900 mg via INTRAVENOUS
  Filled 2017-04-20: qty 50

## 2017-04-20 MED ORDER — SUGAMMADEX SODIUM 200 MG/2ML IV SOLN
INTRAVENOUS | Status: DC | PRN
  Administered 2017-04-20: 400 mg via INTRAVENOUS

## 2017-04-20 MED ORDER — ONDANSETRON HCL 4 MG/2ML IJ SOLN
INTRAMUSCULAR | Status: AC
  Filled 2017-04-20: qty 2

## 2017-04-20 MED ORDER — PHENYLEPHRINE 40 MCG/ML (10ML) SYRINGE FOR IV PUSH (FOR BLOOD PRESSURE SUPPORT)
PREFILLED_SYRINGE | INTRAVENOUS | Status: AC
  Filled 2017-04-20: qty 10

## 2017-04-20 MED ORDER — ACETAMINOPHEN 325 MG PO TABS: 650.0000 mg | ORAL_TABLET | ORAL | Status: DC | PRN

## 2017-04-20 MED ORDER — LIDOCAINE 2% (20 MG/ML) 5 ML SYRINGE
INTRAMUSCULAR | Status: DC | PRN
  Administered 2017-04-20: 80 mg via INTRAVENOUS

## 2017-04-20 MED ORDER — HYDROCODONE-ACETAMINOPHEN 5-325 MG PO TABS
1.0000 | ORAL_TABLET | ORAL | Status: DC | PRN
  Administered 2017-04-20 – 2017-04-22 (×7): 1 via ORAL
  Filled 2017-04-20 (×8): qty 1

## 2017-04-20 MED ORDER — HYDROCORTISONE NA SUCCINATE PF 250 MG IJ SOLR
INTRAMUSCULAR | Status: AC
  Filled 2017-04-20: qty 250

## 2017-04-20 MED ORDER — THROMBIN (RECOMBINANT) 5000 UNITS EX SOLR
CUTANEOUS | Status: AC
  Filled 2017-04-20: qty 10000

## 2017-04-20 MED ORDER — OXYMETAZOLINE HCL 0.05 % NA SOLN
NASAL | Status: AC
Start: 2017-04-20 — End: 2017-04-20
  Filled 2017-04-20: qty 15

## 2017-04-20 MED ORDER — ARTIFICIAL TEARS OPHTHALMIC OINT
TOPICAL_OINTMENT | OPHTHALMIC | Status: DC | PRN
  Administered 2017-04-20: 1 via OPHTHALMIC

## 2017-04-20 MED ORDER — PRAVASTATIN SODIUM 20 MG PO TABS
10.0000 mg | ORAL_TABLET | Freq: Every day | ORAL | Status: DC
  Administered 2017-04-20 – 2017-04-25 (×6): 10 mg via ORAL
  Filled 2017-04-20 (×6): qty 1

## 2017-04-20 MED ORDER — FENTANYL CITRATE (PF) 250 MCG/5ML IJ SOLN
INTRAMUSCULAR | Status: AC
  Filled 2017-04-20: qty 5

## 2017-04-20 MED ORDER — NALOXONE HCL 0.4 MG/ML IJ SOLN: 0.0800 mg | INTRAMUSCULAR | Status: DC | PRN

## 2017-04-20 MED ORDER — MIDAZOLAM HCL 5 MG/5ML IJ SOLN
INTRAMUSCULAR | Status: DC | PRN
  Administered 2017-04-20 (×2): 1 mg via INTRAVENOUS

## 2017-04-20 MED ORDER — PANTOPRAZOLE SODIUM 40 MG PO TBEC
40.0000 mg | DELAYED_RELEASE_TABLET | Freq: Every day | ORAL | Status: DC
  Administered 2017-04-20 – 2017-04-26 (×7): 40 mg via ORAL
  Filled 2017-04-20 (×7): qty 1

## 2017-04-20 MED ORDER — LIDOCAINE-EPINEPHRINE 1 %-1:100000 IJ SOLN
INTRAMUSCULAR | Status: DC | PRN
  Administered 2017-04-20: 9 mL

## 2017-04-20 MED ORDER — PROMETHAZINE HCL 25 MG PO TABS: 12.5000 mg | ORAL_TABLET | ORAL | Status: DC | PRN

## 2017-04-20 MED ORDER — ONDANSETRON HCL 4 MG/2ML IJ SOLN
INTRAMUSCULAR | Status: DC | PRN
  Administered 2017-04-20: 4 mg via INTRAVENOUS

## 2017-04-20 MED ORDER — DOCUSATE SODIUM 100 MG PO CAPS
100.0000 mg | ORAL_CAPSULE | Freq: Two times a day (BID) | ORAL | Status: DC
  Administered 2017-04-20 – 2017-04-26 (×13): 100 mg via ORAL
  Filled 2017-04-20 (×13): qty 1

## 2017-04-20 MED ORDER — MORPHINE SULFATE (PF) 4 MG/ML IV SOLN
1.0000 mg | INTRAVENOUS | Status: DC | PRN
  Administered 2017-04-20: 1 mg via INTRAVENOUS
  Administered 2017-04-20 (×3): 2 mg via INTRAVENOUS
  Administered 2017-04-21: 1 mg via INTRAVENOUS
  Administered 2017-04-21 (×4): 2 mg via INTRAVENOUS
  Administered 2017-04-21: 1 mg via INTRAVENOUS
  Administered 2017-04-22 – 2017-04-23 (×5): 2 mg via INTRAVENOUS
  Administered 2017-04-25: 1 mg via INTRAVENOUS
  Administered 2017-04-26: 2 mg via INTRAVENOUS
  Filled 2017-04-20 (×17): qty 1

## 2017-04-20 MED ORDER — PROPOFOL 10 MG/ML IV BOLUS
INTRAVENOUS | Status: AC
  Filled 2017-04-20: qty 40

## 2017-04-20 MED ORDER — POTASSIUM CHLORIDE IN NACL 20-0.9 MEQ/L-% IV SOLN
INTRAVENOUS | Status: DC
  Administered 2017-04-20: 13:00:00 via INTRAVENOUS
  Filled 2017-04-20 (×3): qty 1000

## 2017-04-20 MED ORDER — CEFAZOLIN SODIUM-DEXTROSE 2-4 GM/100ML-% IV SOLN
INTRAVENOUS | Status: AC
  Filled 2017-04-20: qty 100

## 2017-04-20 MED ORDER — MAGNESIUM CITRATE PO SOLN
1.0000 | Freq: Once | ORAL | Status: DC | PRN
  Filled 2017-04-20: qty 296

## 2017-04-20 MED ORDER — SODIUM CHLORIDE 0.9 % IV SOLN
500.0000 mg | Freq: Two times a day (BID) | INTRAVENOUS | Status: DC
  Administered 2017-04-20 – 2017-04-22 (×4): 500 mg via INTRAVENOUS
  Filled 2017-04-20 (×5): qty 5

## 2017-04-20 MED ORDER — MIDAZOLAM HCL 2 MG/2ML IJ SOLN
INTRAMUSCULAR | Status: AC
  Filled 2017-04-20: qty 2

## 2017-04-20 MED ORDER — LIDOCAINE 2% (20 MG/ML) 5 ML SYRINGE
INTRAMUSCULAR | Status: AC
  Filled 2017-04-20: qty 5

## 2017-04-20 MED ORDER — HYDROCORTISONE 20 MG PO TABS
50.0000 mg | ORAL_TABLET | Freq: Three times a day (TID) | ORAL | Status: AC
  Administered 2017-04-20: 40 mg via ORAL
  Administered 2017-04-20 – 2017-04-21 (×3): 50 mg via ORAL
  Filled 2017-04-20 (×4): qty 1

## 2017-04-20 MED ORDER — THROMBIN (RECOMBINANT) 5000 UNITS EX SOLR
CUTANEOUS | Status: DC | PRN
  Administered 2017-04-20 (×2): 5000 [IU] via TOPICAL

## 2017-04-20 MED ORDER — ONDANSETRON HCL 4 MG PO TABS: 4.0000 mg | ORAL_TABLET | ORAL | Status: DC | PRN

## 2017-04-20 MED ORDER — LABETALOL HCL 5 MG/ML IV SOLN: 10.0000 mg | INTRAVENOUS | Status: DC | PRN

## 2017-04-20 MED ORDER — BISACODYL 5 MG PO TBEC
5.0000 mg | DELAYED_RELEASE_TABLET | Freq: Every day | ORAL | Status: DC | PRN
  Administered 2017-04-24: 5 mg via ORAL
  Filled 2017-04-20: qty 1

## 2017-04-20 MED ORDER — PHENYLEPHRINE 40 MCG/ML (10ML) SYRINGE FOR IV PUSH (FOR BLOOD PRESSURE SUPPORT)
PREFILLED_SYRINGE | INTRAVENOUS | Status: DC | PRN
  Administered 2017-04-20: 120 ug via INTRAVENOUS
  Administered 2017-04-20: 80 ug via INTRAVENOUS
  Administered 2017-04-20: 120 ug via INTRAVENOUS

## 2017-04-20 MED ORDER — HYDROMORPHONE HCL 1 MG/ML IJ SOLN
0.2500 mg | INTRAMUSCULAR | Status: DC | PRN
  Administered 2017-04-20: 0.25 mg via INTRAVENOUS

## 2017-04-20 MED ORDER — FENTANYL CITRATE (PF) 100 MCG/2ML IJ SOLN
INTRAMUSCULAR | Status: DC | PRN
  Administered 2017-04-20 (×5): 50 ug via INTRAVENOUS

## 2017-04-20 MED ORDER — ROCURONIUM BROMIDE 10 MG/ML (PF) SYRINGE
PREFILLED_SYRINGE | INTRAVENOUS | Status: DC | PRN
  Administered 2017-04-20 (×3): 10 mg via INTRAVENOUS
  Administered 2017-04-20: 60 mg via INTRAVENOUS

## 2017-04-20 SURGICAL SUPPLY — 121 items
APL SKNCLS STERI-STRIP NONHPOA (GAUZE/BANDAGES/DRESSINGS)
BALL CTTN LRG ABS STRL LF (GAUZE/BANDAGES/DRESSINGS)
BENZOIN TINCTURE PRP APPL 2/3 (GAUZE/BANDAGES/DRESSINGS) IMPLANT
BLADE 11 SAFETY STRL DISP (BLADE) ×1 IMPLANT
BLADE EYE SICKLE 84 5 BEAV (BLADE) IMPLANT
BLADE ROTATE RAD 40 4 M4 (BLADE) IMPLANT
BLADE ROTATE TRICUT 4X13 M4 (BLADE) ×2 IMPLANT
BLADE SURG 15 STRL LF DISP TIS (BLADE) IMPLANT
BLADE SURG 15 STRL SS (BLADE)
BUR MATCHSTICK NEURO 3.0 LAGG (BURR) IMPLANT
CANISTER SUCT 3000ML PPV (MISCELLANEOUS) ×5 IMPLANT
CARTRIDGE OIL MAESTRO DRILL (MISCELLANEOUS) ×1 IMPLANT
CATH ROBINSON RED A/P 14FR (CATHETERS) IMPLANT
COAGULATOR SUCT 8FR VV (MISCELLANEOUS) ×2 IMPLANT
CONT SPEC 4OZ CLIKSEAL STRL BL (MISCELLANEOUS) ×2 IMPLANT
COTTONBALL LRG STERILE PKG (GAUZE/BANDAGES/DRESSINGS) IMPLANT
DECANTER SPIKE VIAL GLASS SM (MISCELLANEOUS) IMPLANT
DIFFUSER DRILL AIR PNEUMATIC (MISCELLANEOUS) ×2 IMPLANT
DRAIN SUBARACHNOID (WOUND CARE) IMPLANT
DRAPE C-ARM 42X72 X-RAY (DRAPES) ×2 IMPLANT
DRAPE EENT ADH APERT 15X15 STR (DRAPES) IMPLANT
DRAPE HALF SHEET 40X57 (DRAPES) ×1 IMPLANT
DRAPE MICROSCOPE LEICA (MISCELLANEOUS) IMPLANT
DRAPE POUCH INSTRU U-SHP 10X18 (DRAPES) ×2 IMPLANT
DRESSING NASAL POPE 10X1.5X2.5 (GAUZE/BANDAGES/DRESSINGS) IMPLANT
DRSG NASAL POPE 10X1.5X2.5 (GAUZE/BANDAGES/DRESSINGS) ×4
DRSG NASOPORE 8CM (GAUZE/BANDAGES/DRESSINGS) ×1 IMPLANT
DURAPREP 26ML APPLICATOR (WOUND CARE) ×2 IMPLANT
ELECT COATED BLADE 2.86 ST (ELECTRODE) IMPLANT
ELECT NDL TIP 2.8 STRL (NEEDLE) IMPLANT
ELECT NEEDLE TIP 2.8 STRL (NEEDLE) IMPLANT
ELECT REM PT RETURN 9FT ADLT (ELECTROSURGICAL) ×2
ELECTRODE REM PT RTRN 9FT ADLT (ELECTROSURGICAL) ×2 IMPLANT
FILTER ARTHROSCOPY CONVERTOR (FILTER) IMPLANT
GAUZE PACKING FOLDED 2  STR (GAUZE/BANDAGES/DRESSINGS)
GAUZE PACKING FOLDED 2 STR (GAUZE/BANDAGES/DRESSINGS) IMPLANT
GAUZE SPONGE 4X4 12PLY STRL (GAUZE/BANDAGES/DRESSINGS) ×1 IMPLANT
GLOVE BIO SURGEON STRL SZ7 (GLOVE) ×1 IMPLANT
GLOVE BIOGEL M 7.0 STRL (GLOVE) ×4 IMPLANT
GLOVE BIOGEL PI IND STRL 6.5 (GLOVE) IMPLANT
GLOVE BIOGEL PI IND STRL 7.0 (GLOVE) IMPLANT
GLOVE BIOGEL PI IND STRL 8 (GLOVE) IMPLANT
GLOVE BIOGEL PI INDICATOR 6.5 (GLOVE) ×1
GLOVE BIOGEL PI INDICATOR 7.0 (GLOVE) ×3
GLOVE BIOGEL PI INDICATOR 8 (GLOVE) ×2
GLOVE ECLIPSE 6.5 STRL STRAW (GLOVE) ×2 IMPLANT
GLOVE ECLIPSE 7.5 STRL STRAW (GLOVE) ×2 IMPLANT
GLOVE EXAM NITRILE LRG STRL (GLOVE) IMPLANT
GLOVE EXAM NITRILE XL STR (GLOVE) IMPLANT
GLOVE EXAM NITRILE XS STR PU (GLOVE) IMPLANT
GLOVE OPTIFIT SS 7.5 STRL LX (GLOVE) IMPLANT
GLOVE SURG SS PI 6.5 STRL IVOR (GLOVE) ×1 IMPLANT
GOWN STRL REUS W/ TWL LRG LVL3 (GOWN DISPOSABLE) ×4 IMPLANT
GOWN STRL REUS W/ TWL XL LVL3 (GOWN DISPOSABLE) IMPLANT
GOWN STRL REUS W/TWL 2XL LVL3 (GOWN DISPOSABLE) IMPLANT
GOWN STRL REUS W/TWL LRG LVL3 (GOWN DISPOSABLE) ×8
GOWN STRL REUS W/TWL XL LVL3 (GOWN DISPOSABLE) ×4
HEMOSTAT SURGICEL 2X14 (HEMOSTASIS) IMPLANT
KIT BASIN OR (CUSTOM PROCEDURE TRAY) ×4 IMPLANT
KIT ROOM TURNOVER OR (KITS) ×3 IMPLANT
NDL 18GX1X1/2 (RX/OR ONLY) (NEEDLE) IMPLANT
NDL HYPO 25GX1X1/2 BEV (NEEDLE) IMPLANT
NDL HYPO 25X1 1.5 SAFETY (NEEDLE) ×1 IMPLANT
NDL SPNL 22GX3.5 QUINCKE BK (NEEDLE) ×1 IMPLANT
NDL SPNL 25GX3.5 QUINCKE BL (NEEDLE) ×1 IMPLANT
NEEDLE 18GX1X1/2 (RX/OR ONLY) (NEEDLE) IMPLANT
NEEDLE HYPO 25GX1X1/2 BEV (NEEDLE) IMPLANT
NEEDLE HYPO 25X1 1.5 SAFETY (NEEDLE) ×2 IMPLANT
NEEDLE PRECISIONGLIDE 27X1.5 (NEEDLE) IMPLANT
NEEDLE SPNL 22GX3.5 QUINCKE BK (NEEDLE) ×2 IMPLANT
NEEDLE SPNL 25GX3.5 QUINCKE BL (NEEDLE) ×2 IMPLANT
NS IRRIG 1000ML POUR BTL (IV SOLUTION) ×2 IMPLANT
OIL CARTRIDGE MAESTRO DRILL (MISCELLANEOUS) ×2
PACK LAMINECTOMY NEURO (CUSTOM PROCEDURE TRAY) ×2 IMPLANT
PAD ARMBOARD 7.5X6 YLW CONV (MISCELLANEOUS) ×6 IMPLANT
PATTIES SURGICAL .25X.25 (GAUZE/BANDAGES/DRESSINGS) IMPLANT
PATTIES SURGICAL .5 X3 (DISPOSABLE) ×1 IMPLANT
RUBBERBAND STERILE (MISCELLANEOUS) IMPLANT
SHEATH ENDOSCRUB 0 DEG (SHEATH) ×2 IMPLANT
SHEATH ENDOSCRUB 30 DEG (SHEATH) IMPLANT
SHEATH ENDOSCRUB 45 DEG (SHEATH) IMPLANT
SPECIMEN JAR SMALL (MISCELLANEOUS) ×1 IMPLANT
SPLINT NASAL DOYLE BI-VL (GAUZE/BANDAGES/DRESSINGS) ×4 IMPLANT
SPONGE LAP 4X18 X RAY DECT (DISPOSABLE) ×1 IMPLANT
SPONGE NEURO XRAY DETECT 1X3 (DISPOSABLE) ×2 IMPLANT
SPONGE SURGIFOAM ABS GEL SZ50 (HEMOSTASIS) ×1 IMPLANT
STAPLER SKIN PROX WIDE 3.9 (STAPLE) ×1 IMPLANT
STRIP CLOSURE SKIN 1/2X4 (GAUZE/BANDAGES/DRESSINGS) IMPLANT
STRIP CLOSURE SKIN 1/4X4 (GAUZE/BANDAGES/DRESSINGS) IMPLANT
SUT 5.0 PDS RB-1 (SUTURE) ×1
SUT CHROMIC 3 0 PS 2 (SUTURE) IMPLANT
SUT CHROMIC 4 0 P 3 18 (SUTURE) IMPLANT
SUT ETHILON 3 0 FSL (SUTURE) IMPLANT
SUT ETHILON 3 0 PS 1 (SUTURE) ×1 IMPLANT
SUT ETHILON 4 0 PS 2 18 (SUTURE) IMPLANT
SUT ETHILON 6 0 P 1 (SUTURE) ×3 IMPLANT
SUT NOVAFIL 6 0 PRE 2 4412 13 (SUTURE) IMPLANT
SUT PDS PLUS AB 5-0 RB-1 (SUTURE) ×1 IMPLANT
SUT PLAIN 4 0 ~~LOC~~ 1 (SUTURE) ×1 IMPLANT
SUT PROLENE 6 0 BV (SUTURE) IMPLANT
SUT SILK 2 0 PERMA HAND 18 BK (SUTURE) IMPLANT
SUT VIC AB 2-0 CT1 27 (SUTURE)
SUT VIC AB 2-0 CT1 27XBRD (SUTURE) IMPLANT
SUT VIC AB 2-0 CT2 18 VCP726D (SUTURE) IMPLANT
SUT VIC AB 3-0 SH 8-18 (SUTURE) IMPLANT
SUT VIC AB 4-0 P-3 18X BRD (SUTURE) IMPLANT
SUT VIC AB 4-0 P3 18 (SUTURE) ×2
SYR 5ML LL (SYRINGE) IMPLANT
TOWEL GREEN STERILE (TOWEL DISPOSABLE) ×2 IMPLANT
TOWEL GREEN STERILE FF (TOWEL DISPOSABLE) ×1 IMPLANT
TOWEL OR 17X24 6PK STRL BLUE (TOWEL DISPOSABLE) ×2 IMPLANT
TRACKER ENT INSTRUMENT (MISCELLANEOUS) ×2 IMPLANT
TRACKER ENT PATIENT (MISCELLANEOUS) ×2 IMPLANT
TRAP SPECIMEN MUCOUS 40CC (MISCELLANEOUS) ×1 IMPLANT
TRAY ENT MC OR (CUSTOM PROCEDURE TRAY) ×3 IMPLANT
TRAY FOLEY W/METER SILVER 16FR (SET/KITS/TRAYS/PACK) ×2 IMPLANT
TUBE CONNECTING 12X1/4 (SUCTIONS) ×2 IMPLANT
TUBING EXTENTION W/L.L. (IV SETS) ×2 IMPLANT
TUBING STRAIGHTSHOT EPS 5PK (TUBING) IMPLANT
UNDERPAD 30X30 (UNDERPADS AND DIAPERS) ×2 IMPLANT
WATER STERILE IRR 1000ML POUR (IV SOLUTION) ×3 IMPLANT

## 2017-04-20 NOTE — Progress Notes (Signed)
Pt. Complained of right hand been hurting him for several weeks. Stated he had fallen on it back in April 2018. Stated he has also had carpal tunnel surgery on that extremity. Stated Dr. Christella Noa was aware.

## 2017-04-20 NOTE — Anesthesia Procedure Notes (Signed)
Arterial Line Insertion Start/End10/25/2018 7:15 AM, 04/20/2017 7:33 AM Performed by: Imagene Riches, CRNA  Patient location: Pre-op. Preanesthetic checklist: patient identified, IV checked, site marked, risks and benefits discussed, surgical consent, monitors and equipment checked, pre-op evaluation and anesthesia consent Patient sedated Left, radial was placed Catheter size: 20 G Hand hygiene performed  and maximum sterile barriers used  Allen's test indicative of satisfactory collateral circulation Attempts: 2 Procedure performed without using ultrasound guided technique. Following insertion, Biopatch and dressing applied. Post procedure assessment: normal  Patient tolerated the procedure well with no immediate complications.

## 2017-04-20 NOTE — Anesthesia Preprocedure Evaluation (Addendum)
Anesthesia Evaluation  Patient identified by MRN, date of birth, ID band Patient awake    Reviewed: Allergy & Precautions, NPO status , Patient's Chart, lab work & pertinent test results  Airway Mallampati: II  TM Distance: >3 FB     Dental   Pulmonary    breath sounds clear to auscultation       Cardiovascular hypertension,  Rhythm:Regular Rate:Normal     Neuro/Psych    GI/Hepatic Neg liver ROS, GERD  ,  Endo/Other  diabetes  Renal/GU      Musculoskeletal  (+) Arthritis ,   Abdominal   Peds  Hematology  (+) anemia ,   Anesthesia Other Findings   Reproductive/Obstetrics                             Anesthesia Physical Anesthesia Plan  ASA: III  Anesthesia Plan: General   Post-op Pain Management:    Induction: Intravenous  PONV Risk Score and Plan: 2 and Ondansetron, Dexamethasone, Treatment may vary due to age or medical condition and Midazolam  Airway Management Planned:   Additional Equipment:   Intra-op Plan:   Post-operative Plan: Possible Post-op intubation/ventilation  Informed Consent: I have reviewed the patients History and Physical, chart, labs and discussed the procedure including the risks, benefits and alternatives for the proposed anesthesia with the patient or authorized representative who has indicated his/her understanding and acceptance.   Dental advisory given  Plan Discussed with: CRNA  Anesthesia Plan Comments:         Anesthesia Quick Evaluation

## 2017-04-20 NOTE — Transfer of Care (Signed)
Immediate Anesthesia Transfer of Care Note  Patient: Lee [name]  Procedure(s) Performed: TRANSPHENOIDAL RESECTION OF TUMOR (N/A ) ENDOSCOPIC TRANSPHENOIDAL RESECTION WITH FUSION SCAN (N/A )  Patient Location: PACU  Anesthesia Type:General  Level of Consciousness: awake, alert  and oriented  Airway & Oxygen Therapy: Patient Spontanous Breathing and Patient connected to nasal cannula oxygen  Post-op Assessment: Report given to RN and Post -op Vital signs reviewed and stable  Post vital signs: Reviewed and stable  Last Vitals:  Vitals:   04/20/17 0600  BP: 102/62  Pulse: 64  Resp: 18  Temp: 36.9 C  SpO2: 98%    Last Pain:  Vitals:   04/20/17 0635  TempSrc:   PainSc: 6       Patients Stated Pain Goal: 3 (25/05/39 7673)  Complications: No apparent anesthesia complications

## 2017-04-20 NOTE — Anesthesia Procedure Notes (Signed)
Procedure Name: Intubation Date/Time: 04/20/2017 8:01 AM Performed by: Imagene Riches Pre-anesthesia Checklist: Patient identified, Emergency Drugs available, Suction available and Patient being monitored Patient Re-evaluated:Patient Re-evaluated prior to induction Oxygen Delivery Method: Circle System Utilized Preoxygenation: Pre-oxygenation with 100% oxygen Induction Type: IV induction Ventilation: Mask ventilation without difficulty and Oral airway inserted - appropriate to patient size Laryngoscope Size: Miller and 2 Grade View: Grade I Tube type: Oral Number of attempts: 1 Airway Equipment and Method: Stylet and Oral airway Placement Confirmation: ETT inserted through vocal cords under direct vision,  positive ETCO2 and breath sounds checked- equal and bilateral Secured at: 24 cm Tube secured with: Tape Dental Injury: Teeth and Oropharynx as per pre-operative assessment

## 2017-04-20 NOTE — Op Note (Signed)
04/20/2017  10:37 AM  PATIENT:  Lee [name]  51 y.o. male  PRE-OPERATIVE DIAGNOSIS:  PITUITARY TUMOR  POST-OPERATIVE DIAGNOSIS:  PITUITARY TUMOR  PROCEDURE:  Procedure(s): TRANSPHENOIDAL RESECTION OF TUMOR ENDOSCOPIC TRANSPHENOIDAL RESECTION WITH FUSION SCAN  SURGEON: Surgeon(s): Ashok Pall, MD Jerrell Belfast, MD  ASSISTANTS:none, co surgeons  ANESTHESIA:   general  EBL:  Total I/O In: 1000 [I.V.:1000] Out: 25 [Urine:675; Blood:150]  BLOOD ADMINISTERED:none  CELL SAVER GIVEN:none  COUNT:per nursing  DRAINS: none   SPECIMEN:  Source of Specimen:  pituitary gland  DICTATION: Lee [name] was taken to the operating room, intubated, and placed under a general anesthetic without difficulty. He was positioned supine on the OR table. His face, nose and mouth was prepped and draped in a sterile manner. Under separate cover my cosurgeon will dictate his approach and closure.  Once the sphenoid sinus was entered I coagulated the outer dural layer. I removed some more bone from the face of the sphenoid exposing more of the pituitary gland. I coagulated the inner layer then again using an 11 blade I made a cross shaped incision to open the dura. The pituitary released a good deal of lifquified gland. I then using ring curettes started the tumor resection. I used the down going curette to remove the floor of of the tumor, then I used both right and left ring curettes to further remove tumor. 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 I sent samples of the mass to pathology for permanent sectioning. We controlled some minor oozing with cautery. Once done I inspected the sella, and with my belief that the tumor had been removed, the wound was closed.   PLAN OF CARE: Admit to inpatient   PATIENT DISPOSITION:  PACU - hemodynamically stable.   Delay start of Pharmacological VTE agent (>24hrs) due to surgical blood loss or risk of  bleeding:  yes

## 2017-04-20 NOTE — Progress Notes (Signed)
   ENT Progress Note: Procedure(s): TRANSPHENOIDAL RESECTION OF TUMOR ENDOSCOPIC TRANSPHENOIDAL RESECTION WITH FUSION SCAN   Subjective: Patient stable preop  Objective: Vital signs in last 24 hours: Temp:  [98.4 F (36.9 C)] 98.4 F (36.9 C) (10/25 0600) Pulse Rate:  [64] 64 (10/25 0600) Resp:  [18] 18 (10/25 0600) BP: (102)/(62) 102/62 (10/25 0600) SpO2:  [98 %] 98 % (10/25 0600) Weight:  [95.3 kg (210 lb)] 95.3 kg (210 lb) (10/25 0600) Weight change:     Intake/Output from previous day: No intake/output data recorded. Intake/Output this shift: No intake/output data recorded.  Labs:  Recent Labs  04/17/17 1506  WBC 6.1  HGB 10.6*  HCT 32.5*  PLT 250    Recent Labs  04/17/17 1506  NA 134*  K 4.0  CL 103  CO2 23  GLUCOSE 91  BUN 13  CALCIUM 9.3    Studies/Results: No results found.   PHYSICAL EXAM: Patent with nasal septal deviation   Assessment/Plan: Pt for Endoscopic Trans-sphenoidal Pituitary resection    Alicea Wente, [place] 04/20/2017, 7:38 AM

## 2017-04-20 NOTE — Brief Op Note (Signed)
04/20/2017  10:45 AM  PATIENT:  Lee [name]  51 y.o. male  PRE-OPERATIVE DIAGNOSIS:  PITUITARY TUMOR  POST-OPERATIVE DIAGNOSIS:  PITUITARY TUMOR  PROCEDURE:  Procedure(s) with comments: TRANSPHENOIDAL RESECTION OF TUMOR (N/A) - TRANSPHENOIDAL RESECTION OF TUMOR ENDOSCOPIC TRANSPHENOIDAL RESECTION WITH FUSION SCAN (N/A) - ENDOSCOPIC TRANSPHENOIDAL RESECTION WITH FUSION SCAN  SURGEON:  Surgeon(s) and Role: Panel 1:    * Ashok Pall, MD - Primary  Panel 2:    * Jerrell Belfast, MD - Primary  PHYSICIAN ASSISTANT:   ASSISTANTS: none   ANESTHESIA:   general  EBL:  150 mL   BLOOD ADMINISTERED:none  DRAINS: none   LOCAL MEDICATIONS USED:  LIDOCAINE  and Amount: 8 ml  SPECIMEN:  Source of Specimen:  pituitary tumor  DISPOSITION OF SPECIMEN:  PATHOLOGY  COUNTS:  YES  TOURNIQUET:  * No tourniquets in log *  DICTATION: .Other Dictation: Dictation Number [id number]  PLAN OF CARE: Admit to inpatient   PATIENT DISPOSITION:  PACU - hemodynamically stable.   Delay start of Pharmacological VTE agent (>24hrs) due to surgical blood loss or risk of bleeding: not applicable

## 2017-04-20 NOTE — H&P (Signed)
BP 102/62   Pulse 64   Temp 98.4 F (36.9 C) (Oral)   Resp 18   Wt 95.3 kg (210 lb)   SpO2 98%   BMI 31.93 kg/m  Lee [name] comes in today for evaluation of pituitary tumor. He was seen by me in the emergency room about a week ago, and at that time, I felt we could just handle things with an outpatient visit. Lee [name] has had some problems and headaches with this for many months. He is 51 years of age. Works in Scientist, research (life sciences), is right handed. Does not smoke. Does drink socially. No history of substance abuse. He was referred by Dr. Jaynee Eagles to be seen in [place] at [facility] for this problem, but he showed up in the ED, wanted to be taken care of locally. He has had spinal fusion and spinal surgery, herniated disc surgery. He says the symptoms started on Nov 23, 2016. Mother, 2, is in fair health. Father, 58, is in fair health. Diabetes, hypertension, and heart disease present in the family history. He has lost 20 pounds in the last month. He does have a history of night sweats, balance problems, shortness of breath, chest pain, hypercholesterolemia, leg pain, nausea, vomiting, difficulty with urinary strength, neck pain, arm weakness, leg weakness, back pain, arm pain, leg pain, disorientation, anxiety, and excessive thirst. Currently, he is taking Vitamin D2, Trazodone, Pravastatin, Lisinopril, Baclofen, Diclofenac, Butalbital, Acetaminophen, Caffeine tablets for headaches. PHYSICAL EXAMINATION: He is 5 feet 8 inches, weighs 215 pounds. Temperature is 96.9, blood pressure is 100/62, pulse is 56, pain is 6/10. He is alert on examination, oriented by 4, and answers all questions appropriately. Memory, language, attention span, and fund of knowledge are normal. Speech is clear. It is also fluent. Symmetric facial sensation and hearing. Visual fields, I believe, he has a right superior quadrantanopsia or, certainly, is impaired to some degree, at least on manual exam. He has hearing which is intact to voice.  Uvula elevates midline. Shoulder shrug is normal. Tongue protrudes in the midline. Negative Romberg. Normal gait. Muscle tone, bulk, coordination are normal. Grips are slightly weak bilaterally. He states that he has had carpal tunnel surgery on the right hand twice and has some wrist problems. Nevertheless, it is weaker than what I would have expected.  Tumor is approximately 19.6 mm in height 16.4 mm in width. It does attribute and distort the optic nerves to some degree. It is uniformly enhancing. No other lesions are identified. He has a large sphenoid sinus, which the tumor is bulging into. ASSESSMENT AND PLAN: Given the size of the tumor, given the fact that I do believe it has already affected his visual fields, I recommended that he undergo resection of the pituitary mass. He did not have a prolactinoma at that level. It was only 33.9. These labs were taken while he was in the emergency room. I will have him seen by Dr. Jerrell Belfast, whom I work with for these tumors. I will also have him seen by an ophthalmologist to document visual field prior to any surgery. Risks and benefits of bleeding, infection, no relief, stroke, coma, does need for further surgery, inability to resect the tumor, brain damage, nerve damage were discussed, along with other risks. He understands and wishes to proceed.

## 2017-04-21 ENCOUNTER — Encounter (HOSPITAL_COMMUNITY): Payer: Self-pay

## 2017-04-21 LAB — SODIUM
Sodium: 137 mmol/L (ref 135–145)
Sodium: 137 mmol/L (ref 135–145)
Sodium: 137 mmol/L (ref 135–145)

## 2017-04-21 NOTE — Progress Notes (Signed)
Patient urine output above 316ml/hour.  Urine specific gravity 1.0023.  Neurosurgery paged and made aware.  Orders given for q6h Sodium checks.

## 2017-04-21 NOTE — Progress Notes (Signed)
   ENT Progress Note: POD #1 s/p Procedure(s): TRANSPHENOIDAL RESECTION OF TUMOR ENDOSCOPIC TRANSPHENOIDAL RESECTION WITH FUSION SCAN   Subjective: Stable, no d/c  Objective: Vital signs in last 24 hours: Temp:  [98 F (36.7 C)-98.4 F (36.9 C)] 98.4 F (36.9 C) (10/26 1512) Pulse Rate:  [40-83] 52 (10/26 1500) Resp:  [10-23] 15 (10/26 1500) BP: (82-108)/(39-74) 99/61 (10/26 1500) SpO2:  [93 %-100 %] 100 % (10/26 1500) Arterial Line BP: (57-126)/(45-75) 112/46 (10/26 1500) Weight change: 5.245 kg (11 lb 9 oz) Last BM Date: 04/19/17  Intake/Output from previous day: 10/25 0701 - 10/26 0700 In: 3730 [I.V.:3220; IV Piggyback:210] Out: 4370 [Urine:4220; Blood:150] Intake/Output this shift: Total I/O In: 927 [P.O.:250; I.V.:572; IV Piggyback:105] Out: 975 [Urine:975]  Labs: No results for input(s): WBC, HGB, HCT, PLT in the last 72 hours.  Recent Labs  04/21/17 0523 04/21/17 1207  NA 137 137    Studies/Results: No results found.   PHYSICAL EXAM: Nasal passage patent No d/c or leak    Assessment/Plan: Pt stable Plan d/c when cleared by NS F/u in ~4 weeks    Annmarie Plemmons, [place] 04/21/2017, 5:43 PM

## 2017-04-21 NOTE — Op Note (Signed)
NAME:  Lee [name], Lee [name]                       ACCOUNT NO.:  MEDICAL RECORD NO.:  [id number]  LOCATION:                                 FACILITY:  PHYSICIAN:  Ryen L. Wilburn Cornelia, M.D.DATE OF BIRTH:  [birth year]  DATE OF PROCEDURE:  04/20/2017 DATE OF DISCHARGE:                              OPERATIVE REPORT   LOCATION:  [facility] Neurosurgical OR.  PREOPERATIVE DIAGNOSIS:  Pituitary tumor.  POSTOPERATIVE DIAGNOSIS:  Pituitary tumor.  INDICATION FOR SURGERY:  Pituitary tumor.  PROCEDURE:  Endoscopic transsphenoidal pituitary resection.  ENT SURGEON:  Early Chars. Wilburn Cornelia, M.D.  NEUROSURGEON:  Ashok Pall, M.D.  ANESTHESIA:  General endotracheal.  COMPLICATIONS:  None.  ESTIMATED BLOOD LOSS:  Less than 50 mL.  DISPOSITION:  The patient transferred from the operating room to the recovery room in stable condition.  Please note, the neurosurgical component of the operative procedure was dictated as a separate operative note by Dr. Christella Noa.  BRIEF HISTORY:  The patient is a 51 year old male, who was evaluated for headache.  Imaging studies were performed and the patient was found to have a relatively large pituitary mass.  No other significant intracranial findings.  Given the patient's history and findings, he was referred to Dr. Christella Noa for evaluation for neurosurgical resection of his pituitary tumor.  He was referred to [facility] for evaluation of endoscopic nasal approach for pituitary resection.  Prior to his evaluation, a CT scan of the sinus was obtained.  The patient was found to have a deviated nasal septum.  Large bilateral middle turbinate concha bullosa, but otherwise normal sinonasal anatomy without evidence of infection, mass, or tumor within the nasal passageway.  The risks and benefits of the endoscopic nasal approach to pituitary tumor were discussed in detail with the patient and his family and they understood and agreed with our plan for surgery,  which was scheduled on elective basis at [facility] OR.  The patient was seen and evaluated by Dr. Christella Noa, who discussed the neurosurgical risks of the procedure in detail as well.  DESCRIPTION OF PROCEDURE:  The patient was brought to the operating room on April 20, 2017.  He was placed in supine position on the operating table and general endotracheal anesthesia was established without difficulty.  When the patient was adequately anesthetized, he was positioned and prepped and draped.  A surgical time-out was then performed with correct identification of the patient and the proposed surgical procedure.  His nose was then injected with a total of 8 mL of 1% lidocaine with 1:100,000 dilution epinephrine and his nose was packed bilaterally with Afrin-soaked cottonoid pledgets, which were left in place for approximately 10 minutes to allow for vasoconstriction and hemostasis.  The Xomed Fusion head gear was then applied and anatomic and surgical landmarks were identified and confirmed.  The navigation device was used throughout the approach to allow safe access to the pituitary tumor via transsphenoidal approach.  With the patient prepped, draped, and prepared for surgery, the endoscopic approach to pituitary tumor was begun using a 0-degree endoscope.  The patient's nasal cavity was examined bilaterally.  He had a significantly  deviated septum, but this was inferior and was not affecting the approach to the sphenoid.  No septoplasty was performed. The patient had very large bilateral middle turbinate concha bullosa, and these were resected using an endoscopic approach beginning on the patient's left-hand side using a 0-degree telescope.  A vertical incision was created in the anterior aspect of the left middle turbinate.  Endoscopic scissors were then used from anterior to posterior and the lateral aspect of the concha bullosa was resected. This was removed in  its entirety as well as its attachment to the lateral nasal wall.  A small amount of bleeding was encountered at this point and monopolar suction cautery was used to control this.  Attention was then turned to the patient's right-hand side and a right middle turbinate concha bullosa resection was performed in the same fashion with the vertical incision, followed by endoscopic scissor dissection and removal.  This allowed lateralization of the middle turbinates and direct access to the posterior sphenoethmoid recess.  Again on the left- hand side, the superior turbinate was identified and the inferior aspect of the turbinate was resected with Thru-Cutting forceps and a microdebrider.  This allowed access to the left sphenoid sinus ostium, which was identified by direct visualization and with the navigation tool.  The ostium was then enlarged using a microdebrider under direct visualization, dissecting in a lateral fashion.  The ostium was enlarged laterally and superiorly to allow direct access to the left sphenoid sinus.  There was small arterial bleeding area along the sphenopalatine distribution, which was cauterized with monopolar suction cautery.  The right nasal cavity was then examined and the natural ostium of the sphenoid sinus was identified.  Again, this was enlarged in the lateral and superior direction creating a widely patent sphenoidotomy. Inspection of the sphenoid sinus showed anticipated rightward deviation of the intersinus septum.  The intersinus septum was then resected with Thru-Cutting forceps and straight microdebrider.  A septectomy was then performed using a Surveyor, quantity.  The posterior aspect of the nasal septum was transected and Thru-Cutting forceps were used to resect the intervening soft tissue.  A 4 mm osteotome was then used at the base of the sphenoid sinus septum and this was resected with Thru-Cutting forceps.  The inferior lip of the sphenoid sinus  ostium was then carefully dissected and the soft tissue was elevated free, preserving the neurovascular bundle using down cutting bone forceps.  This was enlarged inferiorly to create a widely patent sphenoidotomy.  The sphenoid sinus was then inspected.  The intersinus septum was resected to its attachment on the posterior and inferior aspect of the sinus. Using navigation and direct visualization, the posterior wall of the sphenoid sinus was identified and the tumor was palpable secondary to a moderate amount of bone erosion.  At this point, Dr. Christella Noa began the neurosurgical resection of the pituitary tumor.  This is dictated as a separate operative report.  With the resection of the tumor completed, there were no significant bleeding and no evidence of spinal fluid leak.  No intrapituitary packing was required.  The margins were coapted over the defect and NasoPore absorbable nasal packing was then placed within the sphenoid sinus.  Again, there was no significant active bleeding.  The patient's nasal cavity was thoroughly inspected using a 0-degree endoscope.  The middle turbinates were medialized into their natural anatomic position. Small amounts of bone and surgical debris were removed.  Again, no active bleeding was noted.  Sponge count was correct.  At this point, the patient's nasal cavity and nasopharynx were irrigated and suctioned.  An orogastric tube was passed.  Stomach contents were aspirated.  The patient was then awakened from his anesthetic.  He was extubated and transferred from the operating room to the recovery room in stable condition.          ______________________________ Early Chars Wilburn Cornelia, M.D.     DLS/MEDQ  D:  32/07/3341  T:  04/20/2017  Job:  [id number]

## 2017-04-21 NOTE — Progress Notes (Signed)
Patient ID: Lee [name], male   DOB: 16-Jun-1966, 51 y.o.   MRN: [id number] BP 99/61   Pulse (!) 49   Temp 98.4 F (36.9 C) (Oral)   Resp 17   Ht 5\' 8"  (1.727 m)   Wt 100.5 kg (221 lb 9 oz)   SpO2 100%   BMI 33.69 kg/m  Alert and oriented x 4, speech is clear and fluent Moving all extremities well Pupils equal round and reactive to light Doing well will discontinue Na checks

## 2017-04-22 MED ORDER — LEVETIRACETAM 500 MG PO TABS
500.0000 mg | ORAL_TABLET | Freq: Two times a day (BID) | ORAL | Status: DC
  Administered 2017-04-22 – 2017-04-24 (×5): 500 mg via ORAL
  Filled 2017-04-22 (×5): qty 1

## 2017-04-22 MED ORDER — OXYCODONE-ACETAMINOPHEN 5-325 MG PO TABS
1.0000 | ORAL_TABLET | ORAL | Status: DC | PRN
  Administered 2017-04-22 – 2017-04-26 (×20): 2 via ORAL
  Filled 2017-04-22 (×13): qty 2
  Filled 2017-04-22: qty 1
  Filled 2017-04-22 (×7): qty 2

## 2017-04-22 MED ORDER — BACLOFEN 10 MG PO TABS
10.0000 mg | ORAL_TABLET | Freq: Three times a day (TID) | ORAL | Status: DC | PRN
Start: 2017-04-22 — End: 2017-04-26
  Filled 2017-04-22: qty 1

## 2017-04-22 NOTE — Progress Notes (Signed)
Pt arrived to 3W30 via wheelchair, alert and oriented in no apparent distress.  No complaints of pain at this time.  Will continue to monitor.  Cori Razor, RN

## 2017-04-22 NOTE — Progress Notes (Signed)
Patient ID: Lee [name], male   DOB: Jul 14, 1965, 51 y.o.   MRN: [id number] Subjective:  The patient is alert and pleasant. He is appropriately sore. He looks well.  Objective: Vital signs in last 24 hours: Temp:  [97.9 F (36.6 C)-98.4 F (36.9 C)] 98.1 F (36.7 C) (10/27 0758) Pulse Rate:  [41-59] 47 (10/27 0800) Resp:  [10-21] 17 (10/27 0800) BP: (82-109)/(44-79) 109/56 (10/27 0800) SpO2:  [93 %-100 %] 97 % (10/27 0800) Arterial Line BP: (61-126)/(46-61) 126/58 (10/26 2000)  Intake/Output from previous day: 10/26 0701 - 10/27 0700 In: 1752 [P.O.:970; I.V.:572; IV Piggyback:210] Out: 1975 [Urine:1975] Intake/Output this shift: No intake/output data recorded.  Physical exam the patient is alert and oriented 3. His vision is grossly normal. His speech is normal. He is moving all 4 extremities well. I don't see any evidence of CSF rhinorrhea.  Lab Results: No results for input(s): WBC, HGB, HCT, PLT in the last 72 hours. BMET  Recent Labs  04/21/17 1207 04/21/17 1728  NA 137 137    Studies/Results: No results found.  Assessment/Plan: Postop day #2: The patient is doing well clinically. His urine output was 2 L yesterday. I will transfer him to the floor. We'll plan to  check his sodium tomorrow. He may go home tomorrow.  LOS: 2 days     Tivon Lemoine D 04/22/2017, 8:27 AM

## 2017-04-23 LAB — BASIC METABOLIC PANEL
Anion gap: 7 (ref 5–15)
BUN: 11 mg/dL (ref 6–20)
CO2: 24 mmol/L (ref 22–32)
Calcium: 8.6 mg/dL — ABNORMAL LOW (ref 8.9–10.3)
Chloride: 108 mmol/L (ref 101–111)
Creatinine, Ser: 0.98 mg/dL (ref 0.61–1.24)
GFR calc Af Amer: >60 mL/min (ref >60)
GFR calc non Af Amer: >60 mL/min (ref >60)
Glucose, Bld: 101 mg/dL — ABNORMAL HIGH (ref 65–99)
Potassium: 3.9 mmol/L (ref 3.5–5.1)
Sodium: 139 mmol/L (ref 135–145)

## 2017-04-23 NOTE — Progress Notes (Signed)
Patient ID: Lee [name], male   DOB: 1966/06/27, 51 y.o.   MRN: [id number] Subjective:  The patient is alert and pleasant. He is in no apparent distress. He wants to go home tomorrow. His nose is sore.  Objective: Vital signs in last 24 hours: Temp:  [97.6 F (36.4 C)-98.3 F (36.8 C)] 98 F (36.7 C) (10/28 0928) Pulse Rate:  [44-99] 50 (10/28 0928) Resp:  [20] 20 (10/28 0928) BP: (97-123)/(48-73) 113/65 (10/28 0928) SpO2:  [96 %-100 %] 99 % (10/28 0928)  Intake/Output from previous day: 10/27 0701 - 10/28 0700 In: 1020 [P.O.:1020] Out: -  Intake/Output this shift: Total I/O In: 240 [P.O.:240] Out: -   Physical exam the patient is alert and oriented. He is moving all 4 extremities well. There is no evidence of CSF rhinorrhea.  Lab Results: No results for input(s): WBC, HGB, HCT, PLT in the last 72 hours. BMET  Recent Labs  04/21/17 1728 04/23/17 0448  NA 137 139  K  --  3.9  CL  --  108  CO2  --  24  GLUCOSE  --  101*  BUN  --  11  CREATININE  --  0.98  CALCIUM  --  8.6*    Studies/Results: No results found.  Assessment/Plan: Postop day #3: The patient is doing well. He will likely home tomorrow.  LOS: 3 days     Shawonda Kerce D 04/23/2017, 12:29 PM

## 2017-04-24 MED ORDER — OXYCODONE HCL ER 10 MG PO T12A
10.0000 mg | EXTENDED_RELEASE_TABLET | Freq: Two times a day (BID) | ORAL | Status: DC
  Administered 2017-04-24 – 2017-04-26 (×5): 10 mg via ORAL
  Filled 2017-04-24 (×5): qty 1

## 2017-04-24 NOTE — Progress Notes (Signed)
Patient ID: Lee [name], male   DOB: 1966-05-11, 51 y.o.   MRN: [id number] BP 122/71 (BP Location: Right Arm)   Pulse (!) 55   Temp 97.7 F (36.5 C) (Oral)   Resp 20   Ht 5\' 8"  (1.727 m)   Wt 100.5 kg (221 lb 9 oz)   SpO2 100%   BMI 33.69 kg/m  Alert and oriented x 4, speech is clear and fluent Moving all extremities well Full visual fields. Pain still not well controlled. Will change regimen today.

## 2017-04-24 NOTE — Care Management Important Message (Signed)
Important Message  Patient Details  Name: Lee [name] MRN: [id number] Date of Birth: 1966/02/22   Medicare Important Message Given:  Yes    Bristol Soy Abena 04/24/2017, 9:05 AM

## 2017-04-25 MED ORDER — OXYCODONE HCL 5 MG PO TABS: 5.0000 mg | ORAL_TABLET | Freq: Four times a day (QID) | ORAL | 0 refills | Status: DC | PRN

## 2017-04-25 MED ORDER — OXYCODONE HCL ER 10 MG PO T12A: 10.0000 mg | EXTENDED_RELEASE_TABLET | Freq: Two times a day (BID) | ORAL | 0 refills | Status: DC

## 2017-04-25 NOTE — Progress Notes (Signed)
Patient ID: Lee [name], male   DOB: 23-Jun-1966, 51 y.o.   MRN: [id number] BP (!) 96/51 (BP Location: Right Arm)   Pulse (!) 55   Temp 97.7 F (36.5 C) (Oral)   Resp 16   Ht 5\' 8"  (1.727 m)   Wt 100.5 kg (221 lb 9 oz)   SpO2 100%   BMI 33.69 kg/m  Alert and oriented x 4, speech is clear and fluent Moving well Full eom,  Discharge tomorrow.

## 2017-04-25 NOTE — Care Management Note (Signed)
Case Management Note  Patient Details  Name: Lee [name] MRN: [id number] Date of Birth: [birth year]  Subjective/Objective:                    Action/Plan: Plan is for patient to d/c home with self care. Pt has insurance,  PCP and transportation home. No further needs per CM.   Expected Discharge Date:                  Expected Discharge Plan:  Home/Self Care  In-House Referral:     Discharge planning Services     Post Acute Care Choice:    Choice offered to:     DME Arranged:    DME Agency:     HH Arranged:    [place] Agency:     Status of Service:  Completed, signed off  If discussed at [employer] of Stay Meetings, dates discussed:    Additional Comments:  Pollie Friar, RN 04/25/2017, 2:07 PM

## 2017-04-25 NOTE — Discharge Summary (Signed)
Physician Discharge Summary  Patient ID: Lee [name] MRN: [id number] DOB/AGE: [birth year] 50 y.o.  Admit date: 04/20/2017 Discharge date: 04/25/2017  Admission Diagnoses:Pituitary tumor  Discharge Diagnoses: same Active Problems:   Pituitary tumor   Discharged Condition: good  Hospital Course: Lee [name] was admitted and taken to the operating room for an uncomplicated transsphenoidal pituitary tumor resection. Post op he had very mild DI for which he was able to compensate for with oral fluids. Na remained in the 130's. Neurologically he remains normal. At discharge he is tolerating a normal diet, voiding. Visual fields are unchanged from preop according to the patient.  Treatments: surgery: as above  Discharge Exam: Blood pressure (!) 96/51, pulse (!) 55, temperature 97.7 F (36.5 C), temperature source Oral, resp. rate 16, height 5\' 8"  (1.727 m), weight 100.5 kg (221 lb 9 oz), SpO2 100 %. General appearance: alert, cooperative, appears stated age and mild distress  Disposition: 01-Home or Self Care PITUITARY TUMOR Discharge Instructions    Diet - low sodium heart healthy    Complete by:  As directed    Discharge instructions    Complete by:  As directed    Sinus/Nasal Instructions: 1. Limited activity 2. Liquid and soft diet 3. May bathe and shower 4. Saline nasal spray - 4 puffs/nostril every hour while awake 5. Elevate Head of Bed 6. No nose blowing/Open mouth sneeze   Increase activity slowly    Complete by:  As directed      Allergies as of 04/25/2017   No Known Allergies     Medication List    STOP taking these medications   butalbital-acetaminophen-caffeine 50-325-40 MG tablet Commonly known as:  FIORICET, ESGIC     TAKE these medications   baclofen 10 MG tablet Commonly known as:  LIORESAL Take 10 mg by mouth 3 (three) times daily.   diclofenac 75 MG EC tablet Commonly known as:  VOLTAREN Take 75 mg by mouth 2 (two) times daily.   lisinopril 10  MG tablet Commonly known as:  PRINIVIL,ZESTRIL Take 10 mg by mouth daily.   omeprazole 20 MG capsule Commonly known as:  PRILOSEC Take 20 mg by mouth daily as needed.   oxyCODONE 10 mg 12 hr tablet Commonly known as:  OXYCONTIN Take 1 tablet (10 mg total) by mouth every 12 (twelve) hours.   oxyCODONE 5 MG immediate release tablet Commonly known as:  ROXICODONE Take 1 tablet (5 mg total) by mouth every 6 (six) hours as needed for severe pain.   pravastatin 10 MG tablet Commonly known as:  PRAVACHOL Take 10 mg by mouth daily.   traZODone 150 MG tablet Commonly known as:  DESYREL Take 150 mg by mouth at bedtime.   triamcinolone cream 0.1 % Commonly known as:  KENALOG Apply 1 application topically 2 (two) times daily as needed (skin irritation).   Vitamin D (Ergocalciferol) 50000 units Caps capsule Commonly known as:  DRISDOL Take 50,000 Units by mouth every 7 (seven) days. Sunday      Follow-up Information    Jerrell Belfast, MD. Schedule an appointment as soon as possible for a visit in 4 weeks.   Specialty:  Otolaryngology Contact information: [address] [place] [address] [phone]           Signed: Winfield Cunas 04/25/2017, 6:00 PM

## 2017-04-26 NOTE — Progress Notes (Signed)
Discharge instructions reviewed with patient. RXs given. Transport home by family.  [place], RN

## 2017-04-26 NOTE — Anesthesia Postprocedure Evaluation (Signed)
Anesthesia Post Note  Patient: Lee [name]  Procedure(s) Performed: TRANSPHENOIDAL RESECTION OF TUMOR (N/A ) ENDOSCOPIC TRANSPHENOIDAL RESECTION WITH FUSION SCAN (N/A )     Patient location during evaluation: PACU Anesthesia Type: General Level of consciousness: awake Pain management: pain level controlled Vital Signs Assessment: post-procedure vital signs reviewed and stable Respiratory status: spontaneous breathing Cardiovascular status: stable Anesthetic complications: no    Last Vitals:  Vitals:   04/26/17 0116 04/26/17 0438  BP: 98/61 98/62  Pulse: (!) 45 (!) 46  Resp: 18 18  Temp: 36.6 C 36.7 C  SpO2: 98% 99%    Last Pain:  Vitals:   04/26/17 0438  TempSrc: Oral  PainSc:                  Abrahim Sargent

## 2017-06-12 ENCOUNTER — Other Ambulatory Visit: Payer: Self-pay | Admitting: General Surgery

## 2017-08-04 ENCOUNTER — Other Ambulatory Visit: Payer: Self-pay | Admitting: Neurosurgery

## 2017-08-04 DIAGNOSIS — D497 Neoplasm of unspecified behavior of endocrine glands and other parts of nervous system: Secondary | ICD-10-CM

## 2018-01-10 ENCOUNTER — Other Ambulatory Visit: Payer: Self-pay

## 2018-01-10 ENCOUNTER — Encounter (HOSPITAL_BASED_OUTPATIENT_CLINIC_OR_DEPARTMENT_OTHER): Payer: Self-pay | Admitting: Emergency Medicine

## 2018-01-10 ENCOUNTER — Emergency Department (HOSPITAL_BASED_OUTPATIENT_CLINIC_OR_DEPARTMENT_OTHER)
Admission: EM | Admit: 2018-01-10 | Discharge: 2018-01-10 | Disposition: A | Discharge status: Home / Self Care | Attending: Emergency Medicine | Admitting: Emergency Medicine

## 2018-01-10 DIAGNOSIS — E1140 Type 2 diabetes mellitus with diabetic neuropathy, unspecified: Secondary | ICD-10-CM | POA: Insufficient documentation

## 2018-01-10 DIAGNOSIS — G5602 Carpal tunnel syndrome, left upper limb: Secondary | ICD-10-CM

## 2018-01-10 DIAGNOSIS — M25532 Pain in left wrist: Secondary | ICD-10-CM | POA: Diagnosis present

## 2018-01-10 DIAGNOSIS — I10 Essential (primary) hypertension: Secondary | ICD-10-CM | POA: Insufficient documentation

## 2018-01-10 DIAGNOSIS — X503XXA Overexertion from repetitive movements, initial encounter: Secondary | ICD-10-CM | POA: Insufficient documentation

## 2018-01-10 NOTE — ED Notes (Signed)
Pt states he has not taken any medication since April  Pt states he saw a Environmental education officer that had "healing ointment" and he has not had to take any medication since then

## 2018-01-10 NOTE — ED Provider Notes (Signed)
[facility] DEPT [facility] Provider Note: Georgena Spurling, MD, FACEP  CSN: [id number] MRN: [id number] ARRIVAL: 01/10/18 at [place]: [place]  Wrist Injury   HISTORY OF PRESENT ILLNESS  01/10/18 2:02 AM Lee [name] is a 52 y.o. male who does repetitive lifting at work.  He was moving boxes at work yesterday morning when he had a gradual onset of severe pain in his left wrist.  The pain is located in the mid volar region of the wrist radiating proximally.  The pain is sharp and worse with movement of the wrist.  He had decreased ability to flex his fingers and wrist yesterday but this has improved since.  He is also having paresthesias in his fingertips, sparing the thumb.  He is having no bony pain.  There is no deformity or swelling.  He has a history of carpal tunnel syndrome in his right wrist.   Past Medical History:  Diagnosis Date  . Anemia    history  . Diabetes ([place])   . Diabetic neuropathy ([place])    patient denies  . GERD (gastroesophageal reflux disease)   . High cholesterol   . Hypertension   . Insomnia   . Osteoarthritis   . Paranoid schizophrenia, subchronic condition ([place])    Dr Ruthell Rummage, [place], [place]  . Pituitary adenoma ([place])   . Prediabetes    patient denies  . Sciatica    low back pain    Past Surgical History:  Procedure Laterality Date  . APPENDECTOMY    . BACK SURGERY     x3  . CARPAL TUNNEL RELEASE     bilateral   . CRANIOTOMY N/A 04/20/2017   Procedure: TRANSPHENOIDAL RESECTION OF TUMOR;  Surgeon: Ashok Pall, MD;  Location: [facility];  Service: Neurosurgery;  Laterality: N/A;  TRANSPHENOIDAL RESECTION OF TUMOR  . EYE SURGERY     bilateral cataracts  . HERNIA REPAIR    . ROTATOR CUFF REPAIR Right   . TRANSNASAL APPROACH N/A 04/20/2017   Procedure: ENDOSCOPIC TRANSPHENOIDAL RESECTION WITH FUSION SCAN;  Surgeon: Jerrell Belfast, MD;  Location: [facility];  Service: ENT;  Laterality: N/A;  ENDOSCOPIC TRANSPHENOIDAL RESECTION WITH  FUSION SCAN  . WRIST SURGERY      Family History  Problem Relation Age of Onset  . Diabetes Mother   . Hypertension Mother   . Diabetes Father   . Hypertension Father   . Anxiety disorder Sister   . Diabetes Brother   . Depression Brother     Social History   Tobacco Use  . Smoking status: Never Smoker  . Smokeless tobacco: Never Used  Substance Use Topics  . Alcohol use: Not Currently    Comment: social  . Drug use: No    Comment: hx marijuana use    Prior to Admission medications   Medication Sig Start Date End Date Taking? Authorizing Provider  baclofen (LIORESAL) 10 MG tablet Take 10 mg by mouth 3 (three) times daily.    [provider]  diclofenac (VOLTAREN) 75 MG EC tablet Take 75 mg by mouth 2 (two) times daily.    [provider]  lisinopril (PRINIVIL,ZESTRIL) 10 MG tablet Take 10 mg by mouth daily.    [provider]  omeprazole (PRILOSEC) 20 MG capsule Take 20 mg by mouth daily as needed.    [provider]  oxyCODONE (OXYCONTIN) 10 mg 12 hr tablet Take 1 tablet (10 mg total) by mouth every 12 (twelve) hours. 04/25/17  Ashok Pall, MD  oxyCODONE (ROXICODONE) 5 MG immediate release tablet Take 1 tablet (5 mg total) by mouth every 6 (six) hours as needed for severe pain. 04/25/17   Ashok Pall, MD  pravastatin (PRAVACHOL) 10 MG tablet Take 10 mg by mouth daily.    [provider]  traZODone (DESYREL) 150 MG tablet Take 150 mg by mouth at bedtime.     [provider]  triamcinolone cream (KENALOG) 0.1 % Apply 1 application topically 2 (two) times daily as needed (skin irritation).    [provider]  Vitamin D, Ergocalciferol, (DRISDOL) 50000 units CAPS capsule Take 50,000 Units by mouth every 7 (seven) days. Sunday    [provider]    Allergies Patient has no known allergies.   REVIEW OF SYSTEMS  Negative except as noted here or in the History of Present Illness.   PHYSICAL  EXAMINATION  Initial Vital Signs Blood pressure 123/84, pulse 63, temperature 98.5 F (36.9 C), temperature source Oral, resp. rate 14, height 5\' 8"  (1.727 m), weight 103.4 kg (228 lb), SpO2 98 %.  Examination General: Well-developed, well-nourished male in no acute distress; appearance consistent with age of record HENT: normocephalic; atraumatic Eyes: pupils equal, round and reactive to light; extraocular muscles intact Neck: supple Heart: regular rate and rhythm Lungs: clear to auscultation bilaterally Abdomen: soft; nondistended; nontender; bowel sounds present Extremities: No deformity; full range of motion; pulses normal; positive Tinel's test on the left; positive Phalen's test on the left Neurologic: Awake, alert and oriented; motor function intact in all extremities and symmetric; no facial droop Skin: Warm and dry Psychiatric: Normal mood and affect   RESULTS  Summary of this visit's results, reviewed by myself:   EKG Interpretation  Date/Time:    Ventricular Rate:    PR Interval:    QRS Duration:   QT Interval:    QTC Calculation:   R Axis:     Text Interpretation:        Laboratory Studies: No results found for this or any previous visit (from the past 24 hour(s)). Imaging Studies: No results found.  ED COURSE and MDM  Nursing notes and initial vitals signs, including pulse oximetry, reviewed.  Vitals:   01/10/18 0113 01/10/18 0116  BP:  123/84  Pulse:  63  Resp:  14  Temp:  98.5 F (36.9 C)  TempSrc:  Oral  SpO2:  98%  Weight: 103.4 kg (228 lb)   Height: 5\' 8"  (1.727 m)    Although the patient's paresthesias are atypical for carpal tunnel syndrome his other symptomatology is consistent.  We will splint his left wrist and refer to hand surgery.  He declines medication, even NSAIDs, partly due to religious beliefs.  PROCEDURES    ED DIAGNOSES     ICD-10-CM   1. Carpal tunnel syndrome on left G56.02        Lively Haberman, Jenny Reichmann, MD 01/10/18  [phone]

## 2018-01-10 NOTE — ED Triage Notes (Signed)
Pt states he hurt his left wrist today at work  Pt works at a distribution center [employer]  States Tuesday morning he was moving boxes and injured his wrist  Pt states by the time he got off work at 5 he was unable to make a fist and now he is having numbness in his finger tips

## 2020-04-21 ENCOUNTER — Emergency Department (HOSPITAL_BASED_OUTPATIENT_CLINIC_OR_DEPARTMENT_OTHER): Payer: Worker's Compensation

## 2020-04-21 ENCOUNTER — Other Ambulatory Visit: Payer: Self-pay

## 2020-04-21 ENCOUNTER — Emergency Department (HOSPITAL_BASED_OUTPATIENT_CLINIC_OR_DEPARTMENT_OTHER)
Admission: EM | Admit: 2020-04-21 | Discharge: 2020-04-21 | Disposition: A | Discharge status: Home / Self Care | Payer: Worker's Compensation | Attending: Emergency Medicine | Admitting: Emergency Medicine

## 2020-04-21 ENCOUNTER — Encounter (HOSPITAL_BASED_OUTPATIENT_CLINIC_OR_DEPARTMENT_OTHER): Payer: Self-pay

## 2020-04-21 DIAGNOSIS — E1140 Type 2 diabetes mellitus with diabetic neuropathy, unspecified: Secondary | ICD-10-CM | POA: Diagnosis not present

## 2020-04-21 DIAGNOSIS — M545 Low back pain, unspecified: Secondary | ICD-10-CM | POA: Diagnosis present

## 2020-04-21 DIAGNOSIS — I10 Essential (primary) hypertension: Secondary | ICD-10-CM | POA: Diagnosis not present

## 2020-04-21 DIAGNOSIS — M5441 Lumbago with sciatica, right side: Secondary | ICD-10-CM | POA: Diagnosis not present

## 2020-04-21 LAB — CBC WITH DIFFERENTIAL/PLATELET
Abs Immature Granulocytes: 0.04 10*3/uL (ref 0.00–0.07)
Basophils Absolute: 0.1 10*3/uL (ref 0.0–0.1)
Basophils Relative: 1 %
Eosinophils Absolute: 0.2 10*3/uL (ref 0.0–0.5)
Eosinophils Relative: 2 %
HCT: 40.6 % (ref 39.0–52.0)
Hemoglobin: 13.2 g/dL (ref 13.0–17.0)
Immature Granulocytes: 0 %
Lymphocytes Relative: 10 %
Lymphs Abs: 1.1 10*3/uL (ref 0.7–4.0)
MCH: 29.1 pg (ref 26.0–34.0)
MCHC: 32.5 g/dL (ref 30.0–36.0)
MCV: 89.6 fL (ref 80.0–100.0)
Monocytes Absolute: 1 10*3/uL (ref 0.1–1.0)
Monocytes Relative: 9 %
Neutro Abs: 8.8 10*3/uL — ABNORMAL HIGH (ref 1.7–7.7)
Neutrophils Relative %: 78 %
Platelets: 327 10*3/uL (ref 150–400)
RBC: 4.53 MIL/uL (ref 4.22–5.81)
RDW: 13.7 % (ref 11.5–15.5)
WBC: 11.1 10*3/uL — ABNORMAL HIGH (ref 4.0–10.5)
nRBC: 0 % (ref 0.0–0.2)

## 2020-04-21 LAB — URINALYSIS, ROUTINE W REFLEX MICROSCOPIC
Bilirubin Urine: NEGATIVE
Glucose, UA: NEGATIVE mg/dL
Hgb urine dipstick: NEGATIVE
Ketones, ur: NEGATIVE mg/dL
Leukocytes,Ua: NEGATIVE
Nitrite: NEGATIVE
Protein, ur: NEGATIVE mg/dL
Specific Gravity, Urine: 1.02 (ref 1.005–1.030)
pH: 6 (ref 5.0–8.0)

## 2020-04-21 LAB — COMPREHENSIVE METABOLIC PANEL
ALT: 54 U/L — ABNORMAL HIGH (ref 0–44)
AST: 46 U/L — ABNORMAL HIGH (ref 15–41)
Albumin: 4.4 g/dL (ref 3.5–5.0)
Alkaline Phosphatase: 93 U/L (ref 38–126)
Anion gap: 8 (ref 5–15)
BUN: 14 mg/dL (ref 6–20)
CO2: 27 mmol/L (ref 22–32)
Calcium: 9.7 mg/dL (ref 8.9–10.3)
Chloride: 103 mmol/L (ref 98–111)
Creatinine, Ser: 0.8 mg/dL (ref 0.61–1.24)
GFR, Estimated: >60 mL/min (ref >60)
Glucose, Bld: 101 mg/dL — ABNORMAL HIGH (ref 70–99)
Potassium: 4.5 mmol/L (ref 3.5–5.1)
Sodium: 138 mmol/L (ref 135–145)
Total Bilirubin: 0.4 mg/dL (ref 0.3–1.2)
Total Protein: 7.7 g/dL (ref 6.5–8.1)

## 2020-04-21 MED ORDER — HYDROCODONE-ACETAMINOPHEN 5-325 MG PO TABS: 2.0000 | ORAL_TABLET | ORAL | 0 refills | Status: AC | PRN

## 2020-04-21 MED ORDER — DEXAMETHASONE SODIUM PHOSPHATE 10 MG/ML IJ SOLN
10.0000 mg | Freq: Once | INTRAMUSCULAR | Status: AC
  Administered 2020-04-21: 10 mg via INTRAVENOUS
  Filled 2020-04-21: qty 1

## 2020-04-21 MED ORDER — DIAZEPAM 5 MG/ML IJ SOLN
5.0000 mg | Freq: Once | INTRAMUSCULAR | Status: AC
  Administered 2020-04-21: 5 mg via INTRAVENOUS
  Filled 2020-04-21: qty 2

## 2020-04-21 MED ORDER — HYDROMORPHONE HCL 1 MG/ML IJ SOLN
1.0000 mg | Freq: Once | INTRAMUSCULAR | Status: AC
  Administered 2020-04-21: 1 mg via INTRAVENOUS
  Filled 2020-04-21: qty 1

## 2020-04-21 MED ORDER — METHYLPREDNISOLONE 4 MG PO TBPK: ORAL_TABLET | ORAL | 0 refills | Status: DC

## 2020-04-21 MED ORDER — OXYCODONE-ACETAMINOPHEN 5-325 MG PO TABS
1.0000 | ORAL_TABLET | Freq: Once | ORAL | Status: DC
  Administered 2020-04-21: 1 via ORAL
  Filled 2020-04-21: qty 1

## 2020-04-21 MED ORDER — MORPHINE SULFATE (PF) 4 MG/ML IV SOLN
4.0000 mg | Freq: Once | INTRAVENOUS | Status: AC
  Administered 2020-04-21: 4 mg via INTRAVENOUS
  Filled 2020-04-21: qty 1

## 2020-04-21 NOTE — ED Triage Notes (Addendum)
Pt c/o right lower back pain since fall at work 9/3-pain worse since 12am-slow gait

## 2020-04-21 NOTE — ED Notes (Signed)
ED Provider at bedside. 

## 2020-04-21 NOTE — ED Provider Notes (Addendum)
[facility] EMERGENCY DEPARTMENT Provider Note   CSN: [id number] Arrival date & time: 04/21/20  1908     History Chief Complaint  Patient presents with  . Back Injury    Lee [name] is a 54 y.o. male.  HPI 54 year old male with history of  anemia, GERD, hypertension, paranoid schizophrenia, sciatica presents to the ER with complaints of low back pain.  Patient states he has a history of back surgeries, had a fall where he fell on some pallets at work on 9/3 of this year.  Since then he has been having intermittent pain but this has been manageable at home.  He states that at 12 AM he woke up with sharp right-sided back pain.  He will occasionally get shooting pain down his right leg with movement.  He feels as though it has been more difficult for him to urinate.  He denies any foot drop, but does state that it has been very painful for him to lift his legs.  Denies any loss of bowel bladder control.  He has not taken anything for his pain as he generally does not like to take medicine.  Denies any fevers or chills, no history of IV drug use.  Patient states that diabetes on his problem list is not correct, he states he was prediabetic but does not have a diagnosis and does not take anything for this.    Past Medical History:  Diagnosis Date  . Anemia    history  . Diabetes ([place])   . Diabetic neuropathy ([place])    patient denies  . GERD (gastroesophageal reflux disease)   . High cholesterol   . Hypertension   . Insomnia   . Osteoarthritis   . Paranoid schizophrenia, subchronic condition ([place])    Dr Ruthell Rummage, [facility], [place]  . Pituitary adenoma ([place])   . Prediabetes    patient denies  . Sciatica    low back pain    Patient Active Problem List   Diagnosis Date Noted  . Pituitary tumor 04/20/2017    Past Surgical History:  Procedure Laterality Date  . APPENDECTOMY    . BACK SURGERY     x3  . CARPAL TUNNEL RELEASE     bilateral   . CRANIOTOMY N/A  04/20/2017   Procedure: TRANSPHENOIDAL RESECTION OF TUMOR;  Surgeon: Ashok Pall, MD;  Location: [facility];  Service: Neurosurgery;  Laterality: N/A;  TRANSPHENOIDAL RESECTION OF TUMOR  . EYE SURGERY     bilateral cataracts  . HERNIA REPAIR    . ROTATOR CUFF REPAIR Right   . TRANSNASAL APPROACH N/A 04/20/2017   Procedure: ENDOSCOPIC TRANSPHENOIDAL RESECTION WITH FUSION SCAN;  Surgeon: Jerrell Belfast, MD;  Location: [facility];  Service: ENT;  Laterality: N/A;  ENDOSCOPIC TRANSPHENOIDAL RESECTION WITH FUSION SCAN  . WRIST SURGERY         Family History  Problem Relation Age of Onset  . Diabetes Mother   . Hypertension Mother   . Diabetes Father   . Hypertension Father   . Anxiety disorder Sister   . Diabetes Brother   . Depression Brother     Social History   Tobacco Use  . Smoking status: Never Smoker  . Smokeless tobacco: Never Used  Vaping Use  . Vaping Use: Never used  Substance Use Topics  . Alcohol use: Not Currently  . Drug use: No    Home Medications Prior to Admission medications   Medication Sig Start Date End Date Taking? Authorizing Provider  HYDROcodone-acetaminophen (NORCO/VICODIN) 5-325 MG tablet Take 2 tablets by mouth every 4 (four) hours as needed for up to 3 days. 04/21/20 04/24/20  Garald Balding, PA-C  methylPREDNISolone (MEDROL DOSEPAK) 4 MG TBPK tablet Take as directed until finished 04/21/20   Garald Balding, PA-C    Allergies    Patient has no known allergies.  Review of Systems   Review of Systems  Constitutional: Negative for chills and fever.  HENT: Negative for ear pain and sore throat.   Eyes: Negative for pain and visual disturbance.  Respiratory: Negative for cough and shortness of breath.   Cardiovascular: Negative for chest pain and palpitations.  Gastrointestinal: Negative for abdominal pain and vomiting.  Genitourinary: Positive for difficulty urinating. Negative for dysuria, hematuria, scrotal swelling and testicular pain.    Musculoskeletal: Positive for back pain. Negative for arthralgias.  Skin: Negative for color change and rash.  Neurological: Positive for weakness. Negative for seizures, syncope and headaches.  All other systems reviewed and are negative.   Physical Exam Updated Vital Signs BP 120/80   Pulse 74   Temp 99.2 F (37.3 C) (Oral)   Resp 16   Ht 5\' 8"  (1.727 m)   Wt 99.8 kg   SpO2 97%   BMI 33.45 kg/m   Physical Exam Vitals and nursing note reviewed.  Constitutional:      General: He is not in acute distress.    Appearance: He is well-developed. He is not ill-appearing, toxic-appearing or diaphoretic.  HENT:     Head: Normocephalic and atraumatic.  Eyes:     Conjunctiva/sclera: Conjunctivae normal.  Cardiovascular:     Rate and Rhythm: Normal rate and regular rhythm.     Pulses: Normal pulses.     Heart sounds: Normal heart sounds. No murmur heard.   Pulmonary:     Effort: Pulmonary effort is normal. No respiratory distress.     Breath sounds: Normal breath sounds.  Abdominal:     Palpations: Abdomen is soft.     Tenderness: There is no abdominal tenderness. There is right CVA tenderness.  Musculoskeletal:        General: Tenderness present. No swelling, deformity or signs of injury.     Cervical back: Neck supple.     Right lower leg: No edema.     Left lower leg: No edema.     Comments: Midline tenderness to the L-spine, no midline tenderness to the C, T spine.  Right leg strength range of motion is limited secondary to pain.  Gross sensations intact.  Patient ambulated here in the ED without evidence of foot drop.  Still moving all 4 extremities.  No noticeable step-offs, crepitus, overlying erythema, fluctuance or warmth  Skin:    General: Skin is warm and dry.  Neurological:     Mental Status: He is alert.     Sensory: No sensory deficit.     Motor: Weakness (Secondary to pain) present.     ED Results / Procedures / Treatments   Labs (all labs ordered are  listed, but only abnormal results are displayed) Labs Reviewed  CBC WITH DIFFERENTIAL/PLATELET - Abnormal; Notable for the following components:      Result Value   WBC 11.1 (*)    Neutro Abs 8.8 (*)    All other components within normal limits  COMPREHENSIVE METABOLIC PANEL - Abnormal; Notable for the following components:   Glucose, Bld 101 (*)    AST 46 (*)    ALT 54 (*)  All other components within normal limits  URINALYSIS, ROUTINE W REFLEX MICROSCOPIC    EKG None  Radiology CT Lumbar Spine Wo Contrast  Result Date: 04/21/2020 CLINICAL DATA:  Recent fall.  Severe low back pain. EXAM: CT LUMBAR SPINE WITHOUT CONTRAST TECHNIQUE: Multidetector CT imaging of the lumbar spine was performed without intravenous contrast administration. Multiplanar CT image reconstructions were also generated. COMPARISON:  None. FINDINGS: Segmentation: Standard Alignment: Grade 1 anterolisthesis at L5-S1 Vertebrae: L3-4 PLIF. L4-5 interbody fusion with solid arthrodesis. No acute fracture. Paraspinal and other soft tissues: Negative. Disc levels: L1-2: Unremarkable. L2-3: Moderate facet hypertrophy and small disc bulge. Mild spinal canal stenosis. L3-4: PLIF with posterior decompression.  Stenosis. L4-5: Interbody fusion with solid arthrodesis. L5-S1: Disc space narrowing with endplate sclerosis. Moderate bilateral foraminal stenosis. IMPRESSION: 1. No acute fracture or static subluxation of the lumbar spine. 2. L3-4 PLIF with solid arthrodesis. 3. Moderate bilateral L5-S1 foraminal stenosis. 4. Mild spinal canal stenosis at L2-3. Electronically Signed   By: Ulyses Jarred M.D.   On: 04/21/2020 21:39    Procedures Procedures (including critical care time)  Medications Ordered in ED Medications  dexamethasone (DECADRON) injection 10 mg (has no administration in time range)  HYDROmorphone (DILAUDID) injection 1 mg (has no administration in time range)  morphine 4 MG/ML injection 4 mg (4 mg Intravenous  Given 04/21/20 2033)  diazepam (VALIUM) injection 5 mg (5 mg Intravenous Given 04/21/20 2033)    ED Course  I have reviewed the triage vital signs and the nursing notes.  Pertinent labs & imaging results that were available during my care of the patient were reviewed by me and considered in my medical decision making (see chart for details).    MDM Rules/Calculators/A&P                          54 year old male with complaints of worsening low back pain over the last 24 hours. On arrival, vitals overall reassuring, he is alert, oriented, nontoxic-appearing.  Physical exam with midline tenderness to the L-spine, he does have some limited range of motion secondary to pain.  Gross sensations intact.  Low suspicion for malignancy, abscess, however cauda equina still is on the differential.  Patient did ambulate here without evidence of foot drop, denying any loss of bowel bladder control.  He does state that it is more difficult for him to urinate.  Denies any blood or dysuria.  His CBC here is a mild leukocytosis of 11.1, no other abnormalities noted.  His CMP has no electrode abnormalities, normal renal function, mild transaminitis, likely nonspecific.  UA without evidence of blood or UTI.  Discussed the case with Dr. Darl Householder, given no MRI here at [facility], opted for a CT scan.  This showed no acute abnormalities, some chronic foraminal stenosis.  Patient again ambulated here in the ER after morphine and Valium.  States he is still having some pain but it is mildly improved.  As per discussion with Dr. Darl Householder, no indication for transfer for MRI at this time. Pt given a dose of dilaudid and decadron here.  Will send home patient on short course of Norco, medrol dose pack, referral to neurosurgery.  Return precautions discussed.  He voiced understanding and is agreeable.  At this stage in the ED course, the patient is medically screened and stable for discharge  Case discussed with Dr. Darl Householder who is  agreeable to the above plan and disposition Final Clinical Impression(s) /  ED Diagnoses Final diagnoses:  Acute right-sided low back pain with right-sided sciatica    Rx / DC Orders ED Discharge Orders         Ordered    methylPREDNISolone (MEDROL DOSEPAK) 4 MG TBPK tablet        04/21/20 2225    HYDROcodone-acetaminophen (NORCO/VICODIN) 5-325 MG tablet  Every 4 hours PRN        04/21/20 2225             Lyndel Safe 04/21/20 2229    Drenda Freeze, MD 04/21/20 2256

## 2020-04-21 NOTE — Discharge Instructions (Addendum)
Please take the pain medicine and steroids as needed for pain.  Please make sure to follow-up with [facility]surgery for the symptoms you are experiencing today.  Your liver function tests were slightly elevated today, for an unknown cause.  Please follow-up with your primary care doctor in order to have these rechecked.  Return to the ER if you have sudden inability to lift her leg, loss of bowel or bladder control, fevers, chills or any other concerning symptoms.

## 2021-03-24 IMAGING — CT CT L SPINE W/O CM
3 series · 12 of 33 positions shown, 14 images · non-contrast
Comparison: None.

CLINICAL DATA: Recent fall.  Severe low back pain.

EXAM:
CT LUMBAR SPINE WITHOUT CONTRAST
TECHNIQUE: Multidetector CT imaging of the lumbar spine was performed without
intravenous contrast administration. Multiplanar CT image
reconstructions were also generated.

[Series 4: l spine soft (person_name) · axial · 0.40mm/px · z∈[-475,-267]mm · 4 of 150 slices shown, 5 images]
[im 23/150  soft-tissue]
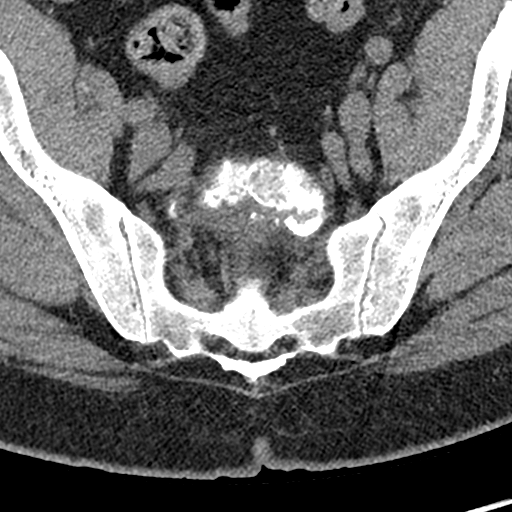
[im 23/150  bone]
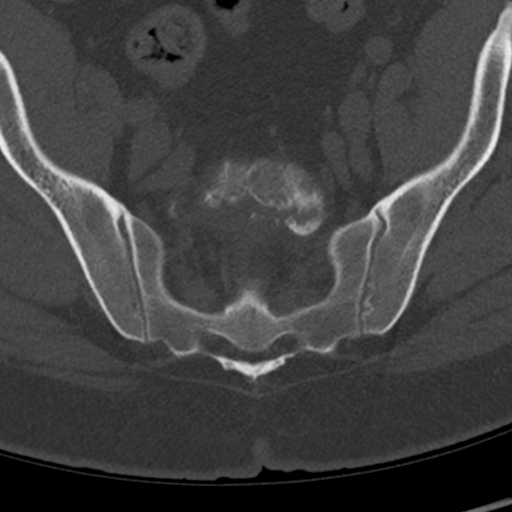
[im 58/150  bone]
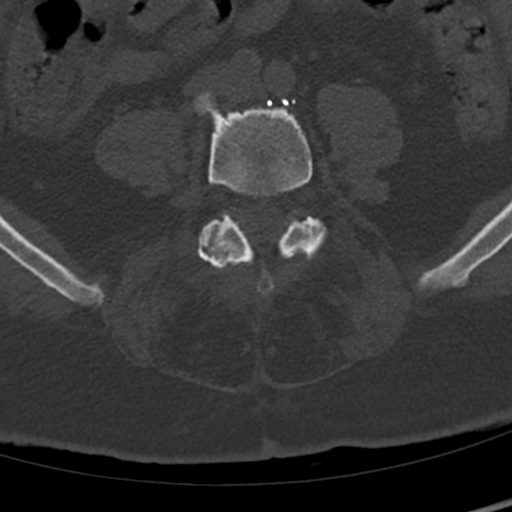
[im 92/150  bone]
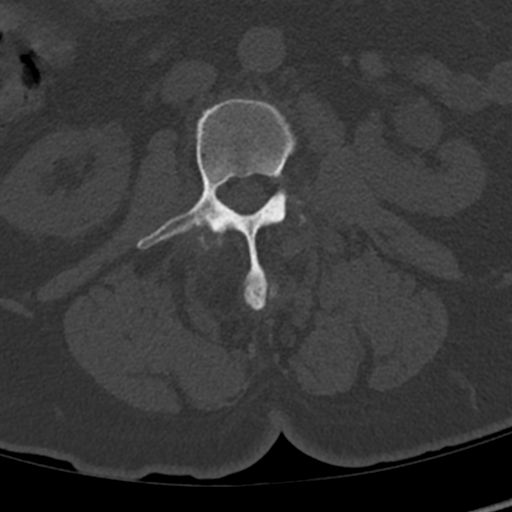
[im 127/150  bone]
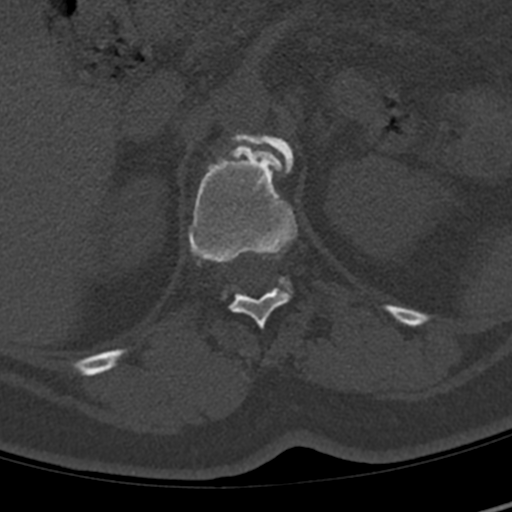

[Series 5: sagittal bone · sagittal · 0.34mm/px · 5 of 66 slices shown, 6 images]
[im 22/66  bone]
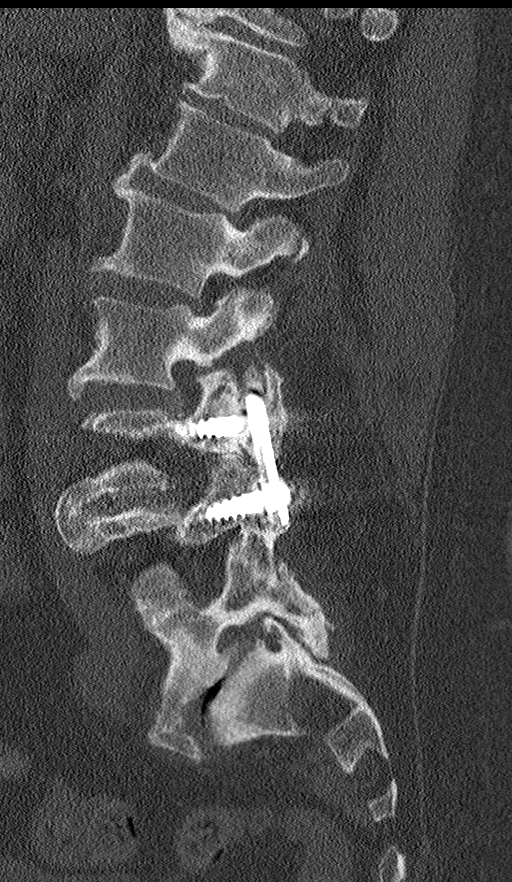
[im 28/66  bone]
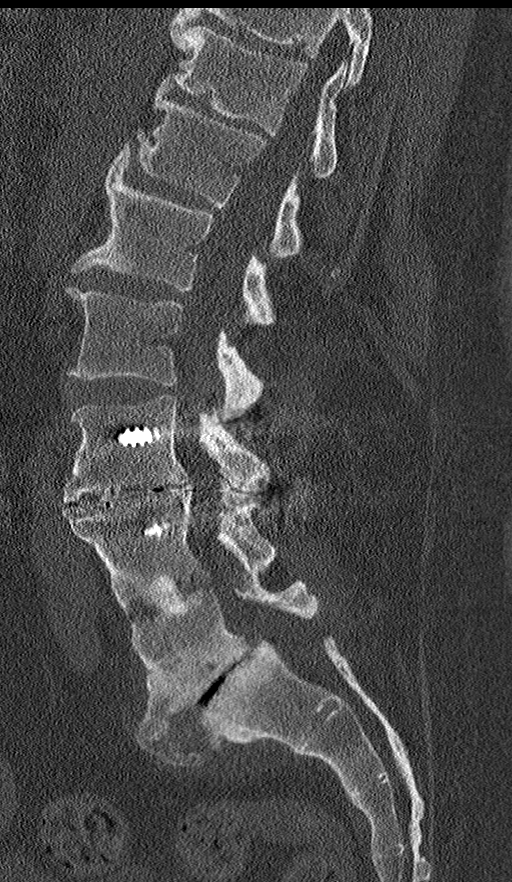
[im 33/66  soft-tissue]
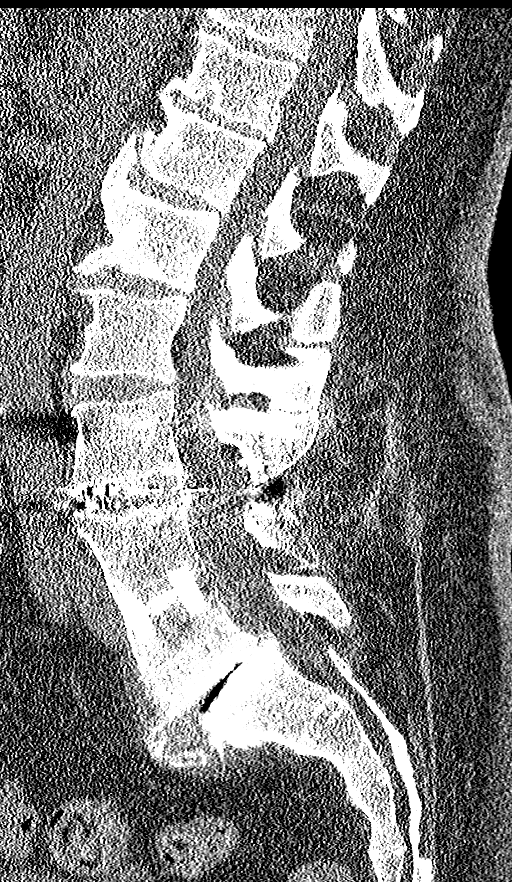
[im 33/66  bone]
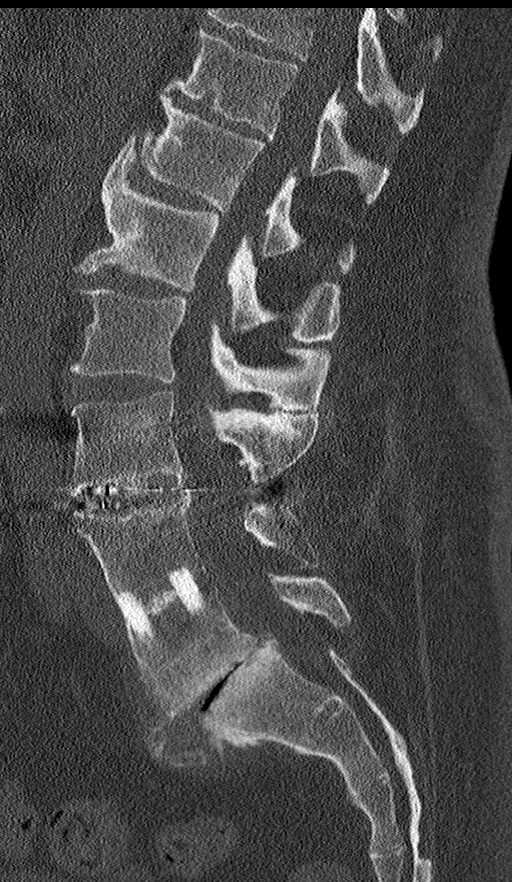
[im 38/66  bone]
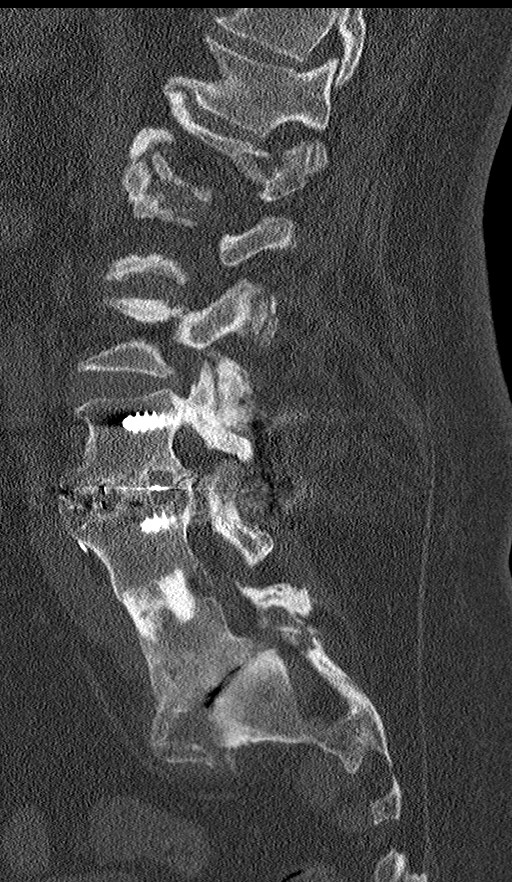
[im 44/66  bone]
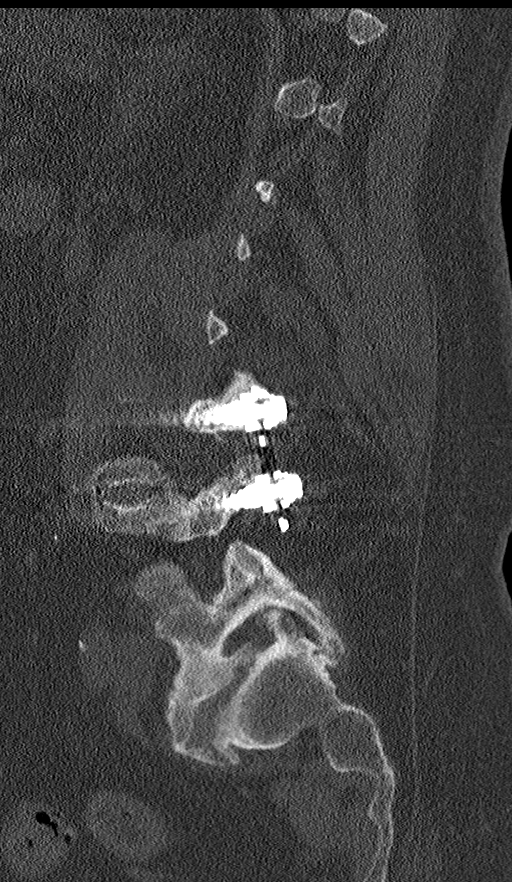

[Series 6: coronal bone · coronal · 0.36mm/px · 3 of 87 slices shown]
[im 18/87  bone]
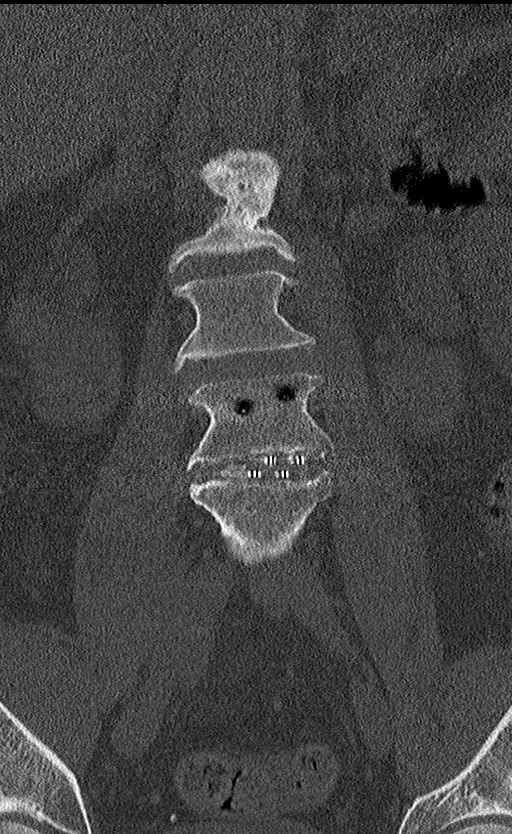
[im 35/87  bone]
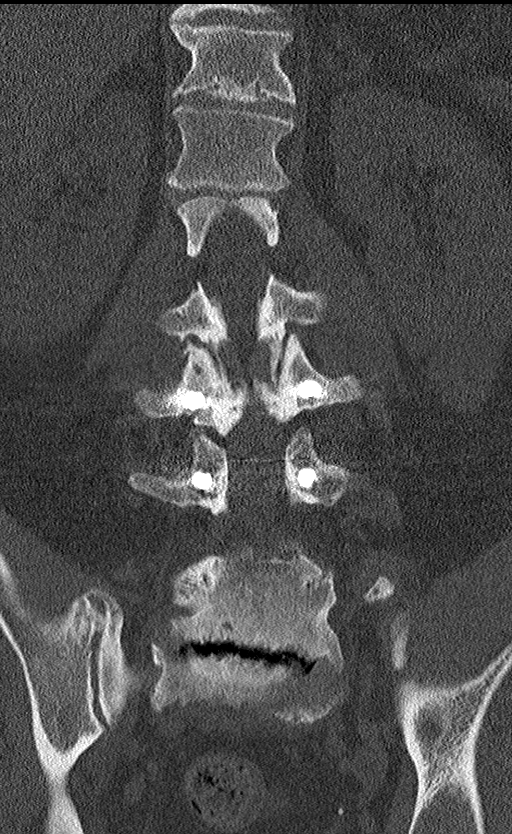
[im 52/87  bone]
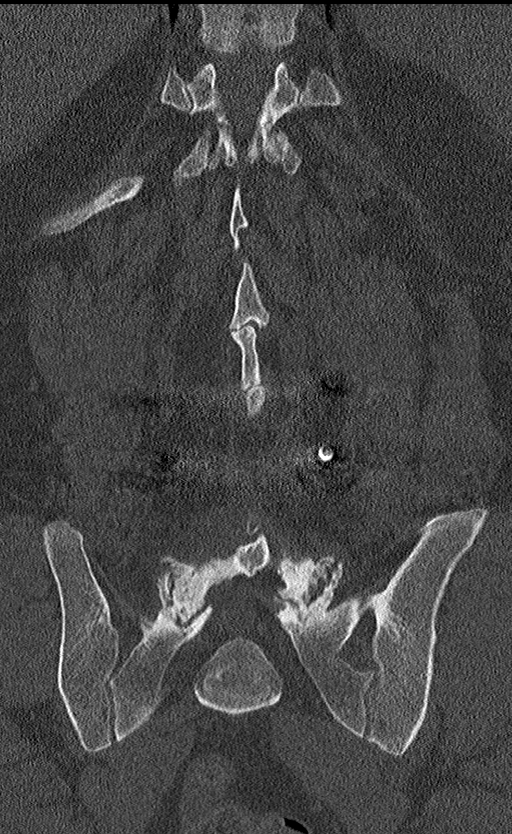

[12 of 33 positions shown; findings below may reference images not displayed]

FINDINGS: Segmentation: Standard

Alignment: Grade 1 anterolisthesis at L5-S1

Vertebrae: L3-4 PLIF. L4-5 interbody fusion with solid arthrodesis.
No acute fracture.

Paraspinal and other soft tissues: Negative.

Disc levels:

L1-2: Unremarkable.

L2-3: Moderate facet hypertrophy and small disc bulge. Mild spinal
canal stenosis.

L3-4: PLIF with posterior decompression.  Stenosis.

L4-5: Interbody fusion with solid arthrodesis.

L5-S1: Disc space narrowing with endplate sclerosis. Moderate
bilateral foraminal stenosis.
IMPRESSION: 1. No acute fracture or static subluxation of the lumbar spine.
2. L3-4 PLIF with solid arthrodesis.
3. Moderate bilateral L5-S1 foraminal stenosis.
4. Mild spinal canal stenosis at L2-3.

## 2021-08-26 ENCOUNTER — Emergency Department (HOSPITAL_BASED_OUTPATIENT_CLINIC_OR_DEPARTMENT_OTHER): Payer: 59

## 2021-08-26 ENCOUNTER — Other Ambulatory Visit: Payer: Self-pay

## 2021-08-26 ENCOUNTER — Emergency Department (HOSPITAL_BASED_OUTPATIENT_CLINIC_OR_DEPARTMENT_OTHER)
Admission: EM | Admit: 2021-08-26 | Discharge: 2021-08-26 | Disposition: A | Discharge status: Home / Self Care | Payer: 59 | Attending: Emergency Medicine | Admitting: Emergency Medicine

## 2021-08-26 ENCOUNTER — Encounter (HOSPITAL_BASED_OUTPATIENT_CLINIC_OR_DEPARTMENT_OTHER): Payer: Self-pay | Admitting: Emergency Medicine

## 2021-08-26 DIAGNOSIS — E119 Type 2 diabetes mellitus without complications: Secondary | ICD-10-CM | POA: Insufficient documentation

## 2021-08-26 DIAGNOSIS — I10 Essential (primary) hypertension: Secondary | ICD-10-CM | POA: Insufficient documentation

## 2021-08-26 DIAGNOSIS — M25512 Pain in left shoulder: Secondary | ICD-10-CM | POA: Diagnosis present

## 2021-08-26 NOTE — Discharge Instructions (Addendum)
Nature of pain concerning for rotator cuff injury to the left shoulder.  X-rays negative.  Make an appointment to follow-up with sports medicine.  Wear the shoulder immobilizer at all times except for showering.  Work note provided. ? ? ?

## 2021-08-26 NOTE — ED Triage Notes (Signed)
Pt report L shoulder pain worse with raising arm. No known injury, but lifts boxes at work. ?

## 2021-08-26 NOTE — ED Provider Notes (Signed)
?[facility] EMERGENCY DEPARTMENT ?Provider Note ? ? ?CSN: [id number] ?Arrival date & time: 08/26/21  1021 ? ?  ? ?History ? ?Chief Complaint  ?Patient presents with  ? Shoulder Pain  ? ? ?Lee [name] is a 56 y.o. male. ? ?Patient with a complaint of left shoulder pain for approximately 2 weeks.  Known known direct injury.  But lifts a lot of boxes at work.  Patient many years ago had a right rotator cuff injury with surgery and it reminds him of that.  Pain is worse with raising his arm.  Patient states occasionally has some numbness to the hand left hand that is.  But none now.  Denies any neck pain. ? ?Past medical history sniffing for hypertension high cholesterol diabetes. ? ? ?  ? ?Home Medications ?Prior to Admission medications   ?Not on File  ?   ? ?Allergies    ?Patient has no known allergies.   ? ?Review of Systems   ?Review of Systems  ?Constitutional:  Negative for chills and fever.  ?HENT:  Negative for ear pain and sore throat.   ?Eyes:  Negative for pain and visual disturbance.  ?Respiratory:  Negative for cough and shortness of breath.   ?Cardiovascular:  Negative for chest pain and palpitations.  ?Gastrointestinal:  Negative for abdominal pain and vomiting.  ?Genitourinary:  Negative for dysuria and hematuria.  ?Musculoskeletal:  Negative for arthralgias, back pain, joint swelling and neck pain.  ?Skin:  Negative for color change and rash.  ?Neurological:  Positive for numbness. Negative for seizures, syncope and weakness.  ?All other systems reviewed and are negative. ? ?Physical Exam ?Updated Vital Signs ?BP (!) 120/92 (BP Location: Left Arm)   Pulse (!) 55   Temp 97.8 ?F (36.6 ?C) (Oral)   Resp 18   Ht 1.727 m (5\' 8" )   Wt 99.8 kg   SpO2 100%   BMI 33.45 kg/m?  ?Physical Exam ?Vitals and nursing note reviewed.  ?Constitutional:   ?   General: He is not in acute distress. ?   Appearance: Normal appearance. He is well-developed.  ?HENT:  ?   Head: Normocephalic and atraumatic.   ?Eyes:  ?   Extraocular Movements: Extraocular movements intact.  ?   Conjunctiva/sclera: Conjunctivae normal.  ?   Pupils: Pupils are equal, round, and reactive to light.  ?Cardiovascular:  ?   Rate and Rhythm: Normal rate and regular rhythm.  ?   Heart sounds: No murmur heard. ?Pulmonary:  ?   Effort: Pulmonary effort is normal. No respiratory distress.  ?   Breath sounds: Normal breath sounds.  ?Abdominal:  ?   Palpations: Abdomen is soft.  ?   Tenderness: There is no abdominal tenderness.  ?Musculoskeletal:     ?   General: No swelling, tenderness or deformity.  ?   Cervical back: Normal range of motion and neck supple. No rigidity.  ?   Comments: Patient with good radial pulse 2+ cap refill to the left hand is less than 2 seconds.  Sensation intact.  Good movement of fingers.  Good movement at the risk of movement at the elbow.  Shoulder has increased pain with trying to put the hand on top of the head.  No obvious deformity.  ?Skin: ?   General: Skin is warm and dry.  ?   Capillary Refill: Capillary refill takes less than 2 seconds.  ?Neurological:  ?   General: No focal deficit present.  ?   Mental  Status: He is alert and oriented to person, place, and time.  ?Psychiatric:     ?   Mood and Affect: Mood normal.  ? ? ?ED Results / Procedures / Treatments   ?Labs ?(all labs ordered are listed, but only abnormal results are displayed) ?Labs Reviewed - No data to display ? ?EKG ?None ? ?Radiology ?DG Shoulder Left ? ?Result Date: 08/26/2021 ?CLINICAL DATA:  Shoulder pain worse with raising arm for 2 weeks, pain over head, pain at night EXAM: LEFT SHOULDER - 2+ VIEW COMPARISON:  None FINDINGS: Osseous mineralization normal. AC joint alignment normal. No acute fracture, dislocation or bone destruction. Visualized ribs unremarkable. IMPRESSION: Normal exam. Electronically Signed   By: Lavonia Dana M.D.   On: 08/26/2021 11:04   ? ?Procedures ?Procedures  ? ? ?Medications Ordered in ED ?Medications - No data to  display ? ?ED Course/ Medical Decision Making/ A&P ?  ?                        ?Medical Decision Making ?Amount and/or Complexity of Data Reviewed ?Radiology: ordered. ? ? ?X-ray negative for any significant abnormalities.  Clinically it acts like rotator cuff injury.  May heal on its own.  Will refer to sports medicine shoulder immobilizer work note. ? ? ?Final Clinical Impression(s) / ED Diagnoses ?Final diagnoses:  ?Acute pain of left shoulder  ? ? ?Rx / DC Orders ?ED Discharge Orders   ? ? None  ? ?  ? ? ?  ?Fredia Sorrow, MD ?08/26/21 1319 ? ?

## 2021-08-30 ENCOUNTER — Encounter (HOSPITAL_BASED_OUTPATIENT_CLINIC_OR_DEPARTMENT_OTHER): Payer: Self-pay | Admitting: Urology

## 2021-08-30 ENCOUNTER — Emergency Department (HOSPITAL_BASED_OUTPATIENT_CLINIC_OR_DEPARTMENT_OTHER)
Admission: EM | Admit: 2021-08-30 | Discharge: 2021-08-30 | Disposition: A | Discharge status: Home / Self Care | Payer: 59 | Attending: Emergency Medicine | Admitting: Emergency Medicine

## 2021-08-30 ENCOUNTER — Other Ambulatory Visit: Payer: Self-pay

## 2021-08-30 ENCOUNTER — Ambulatory Visit: Payer: 59 | Admitting: Family Medicine

## 2021-08-30 DIAGNOSIS — Z7689 Persons encountering health services in other specified circumstances: Secondary | ICD-10-CM

## 2021-08-30 DIAGNOSIS — X58XXXA Exposure to other specified factors, initial encounter: Secondary | ICD-10-CM | POA: Insufficient documentation

## 2021-08-30 DIAGNOSIS — M25512 Pain in left shoulder: Secondary | ICD-10-CM | POA: Diagnosis not present

## 2021-08-30 NOTE — ED Triage Notes (Signed)
Seen for left shoulder pain Thursday ?Now having normal range of motion ?Unable to follow up due to insurance ?Wants a note to go back to work  ?Denies pain  ?

## 2021-08-30 NOTE — Discharge Instructions (Addendum)
Like we discussed, attached is your note to return to work.  I would still recommend that you follow-up with orthopedics as needed.  If you develop any new or worsening symptoms please come back to the emergency department. ?

## 2021-08-30 NOTE — ED Provider Notes (Signed)
?[facility] EMERGENCY DEPARTMENT ?Provider Note ? ? ?CSN: [id number] ?Arrival date & time: 08/30/21  1815 ? ?  ? ?History ? ?Chief Complaint  ?Patient presents with  ? Follow-up  ? ? ?MANAS HICKLING is a 56 y.o. male. ? ?HPI ?Patient is a 56 year old male who presents to the emergency department for reevaluation of left shoulder pain.  Patient initially presented to the emergency department on March 2 (4 days ago) due to left shoulder pain.  At the time he had negative x-rays of the left shoulder and the physician felt that it was consistent with a rotator cuff injury.  He was placed in a shoulder immobilizer and discharged with a work note.  He states that he took ibuprofen after being discharged and his symptoms have completely resolved.  States he has not followed up with sports medicine as of yet.  Denies any weakness, numbness, or pain.  He states he has no physical complaints at this time and would like a work note to return to work. ?  ? ?Home Medications ?Prior to Admission medications   ?Not on File  ?   ? ?Allergies    ?Patient has no known allergies.   ? ?Review of Systems   ?Review of Systems  ?Musculoskeletal:  Negative for arthralgias and myalgias.  ?Skin:  Negative for color change.  ?Neurological:  Negative for weakness and numbness.  ? ?Physical Exam ?Updated Vital Signs ?BP (!) 155/89 (BP Location: Left Arm)   Pulse 64   Temp 98 ?F (36.7 ?C) (Oral)   Resp 18   Ht '5\' 8"'$  (1.727 m)   Wt 99.8 kg   SpO2 100%   BMI 33.45 kg/m?  ?Physical Exam ?Vitals and nursing note reviewed.  ?Constitutional:   ?   General: He is not in acute distress. ?   Appearance: He is well-developed.  ?HENT:  ?   Head: Normocephalic and atraumatic.  ?   Right Ear: External ear normal.  ?   Left Ear: External ear normal.  ?Eyes:  ?   General: No scleral icterus.    ?   Right eye: No discharge.     ?   Left eye: No discharge.  ?   Conjunctiva/sclera: Conjunctivae normal.  ?Neck:  ?   Trachea: No tracheal deviation.   ?Cardiovascular:  ?   Rate and Rhythm: Normal rate.  ?Pulmonary:  ?   Effort: Pulmonary effort is normal. No respiratory distress.  ?   Breath sounds: No stridor.  ?Abdominal:  ?   General: There is no distension.  ?Musculoskeletal:     ?   General: No swelling, tenderness, deformity or signs of injury.  ?   Cervical back: Neck supple.  ?   Comments: No tenderness appreciated in the left shoulder, elbow, wrist, or hand.  Full range of motion of all of the joints in the left arm.  Strength is 5/5 in the bilateral upper extremities.  Grip strength is 5/5 and symmetric.  2+ radial pulses.  Distal sensation intact  ?Skin: ?   General: Skin is warm and dry.  ?   Findings: No rash.  ?Neurological:  ?   Mental Status: He is alert.  ?   Cranial Nerves: Cranial nerve deficit: no gross deficits.  ? ?ED Results / Procedures / Treatments   ?Labs ?(all labs ordered are listed, but only abnormal results are displayed) ?Labs Reviewed - No data to display ? ?EKG ?None ? ?Radiology ?No results found. ? ?  Procedures ?Procedures  ? ?Medications Ordered in ED ?Medications - No data to display ? ?ED Course/ Medical Decision Making/ A&P ?  ?                        ?Medical Decision Making ?Patient is a 56 year old male who presents to the emergency department for reevaluation of left shoulder pain.  Patient initially seen 4 days ago and discharged in a shoulder immobilizer for a possible rotator cuff injury.  He states that he has not followed up with sports medicine but his symptoms have since resolved and he would like a work note to return to work. ? ?On my exam patient has no tenderness whatsoever in the left arm, particularly the shoulder.  Strength is 5/5 in the bilateral upper extremities.  Neurovascularly intact in the bilateral upper extremities.  No deficits. ? ?Feel that it is reasonable to allow the patient to return to work.  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  Urged him to still follow-up with sports medicine as needed.  Also discussed return precautions.  His questions were answered and he was amicable at the time of discharge. ?Final Clinical Impression(s) / ED Diagnoses ?Final diagnoses:  ?Return to work evaluation  ? ?Rx / DC Orders ?ED Discharge Orders   ? ? None  ? ?  ? ? ?  ?Rayna Sexton, PA-C ?08/30/21 1854 ? ?  ?Lorelle Gibbs, DO ?08/30/21 2312 ? ?

## 2022-07-29 IMAGING — DX DG SHOULDER 2+V*L*
3 series · 3 of 3 positions shown · non-contrast
Comparison: None

CLINICAL DATA: Shoulder pain worse with raising arm for 2 weeks,
pain over head, pain at night

EXAM:
LEFT SHOULDER - 2+ VIEW

[shoulder grashey]
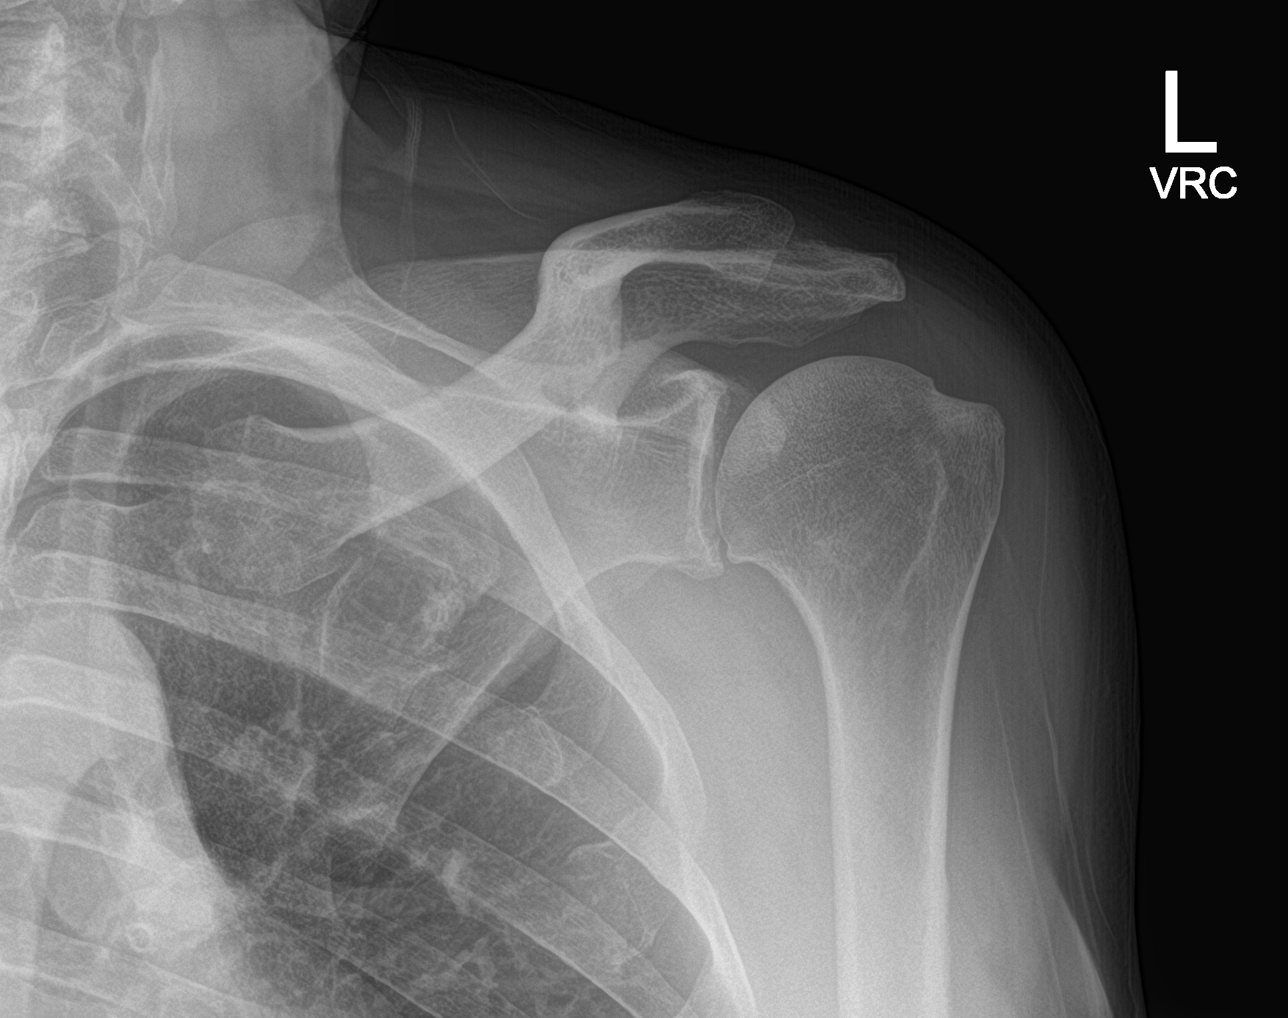

[shoulder y view]
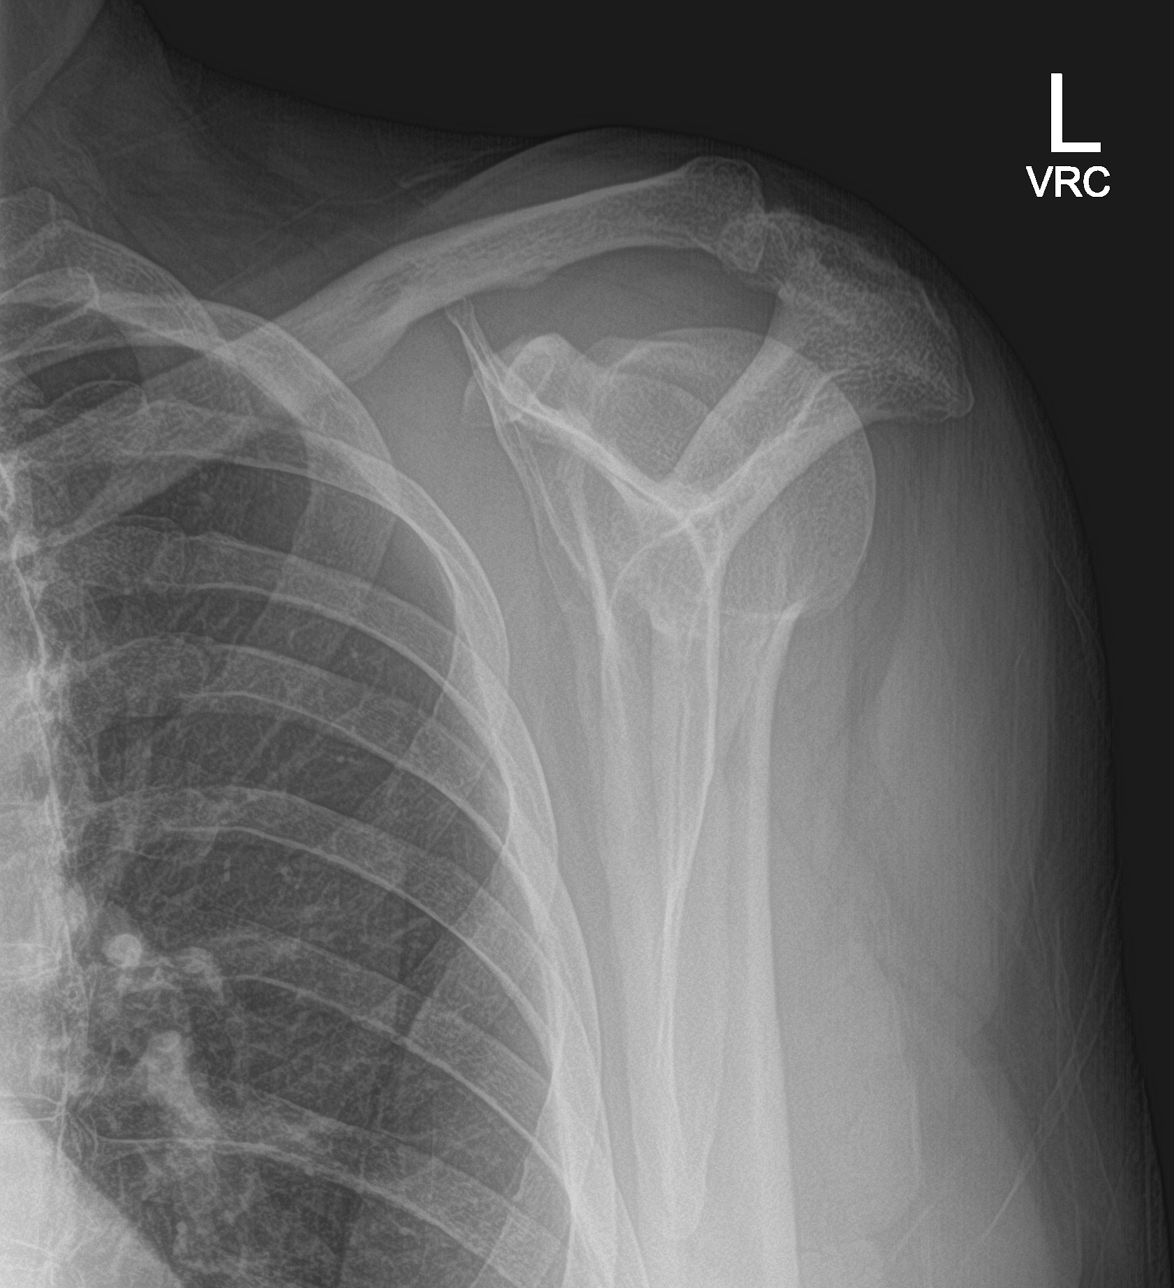

[shoulder axillary]
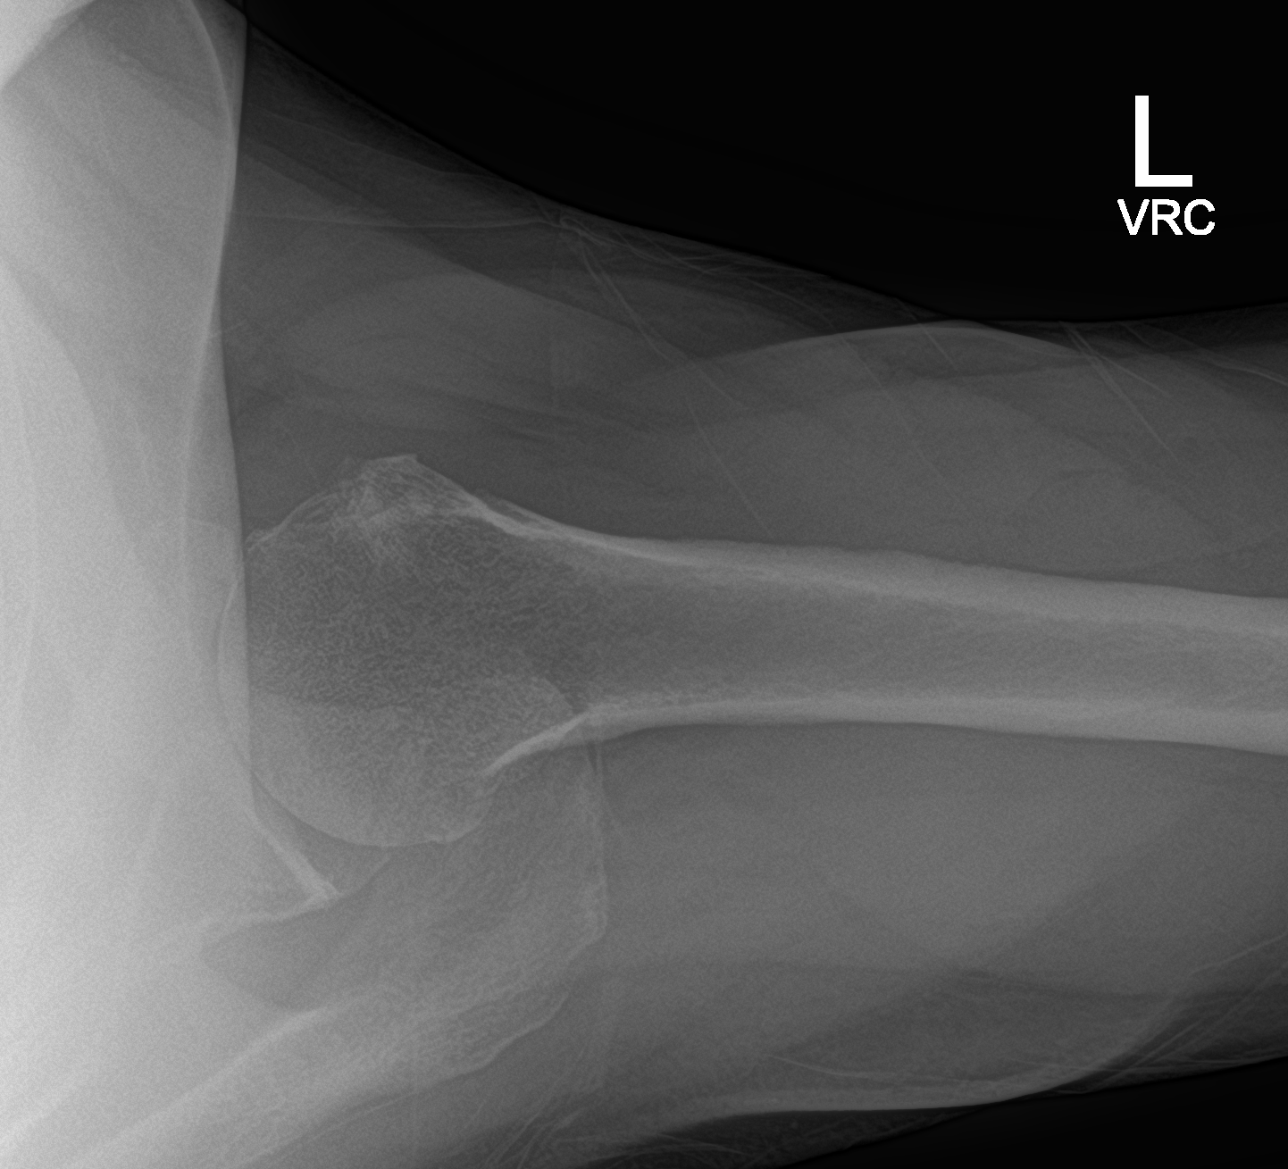

[3 of 3 positions shown; findings below may reference images not displayed]

FINDINGS: Osseous mineralization normal.

AC joint alignment normal.

No acute fracture, dislocation or bone destruction.

Visualized ribs unremarkable.
IMPRESSION: Normal exam.

## 2022-10-13 ENCOUNTER — Encounter: Payer: Self-pay | Admitting: *Deleted
